# Patient Record
Sex: Female | Born: 1960 | Race: White | Hispanic: No | Marital: Married | Smoking: Never smoker
Health system: Southern US, Community
[De-identification: ages and names within clinical notes are randomized; demographics above are authoritative.]

## PROBLEM LIST (undated history)

## (undated) DIAGNOSIS — T7840XA Allergy, unspecified, initial encounter: Secondary | ICD-10-CM

## (undated) DIAGNOSIS — M1990 Unspecified osteoarthritis, unspecified site: Secondary | ICD-10-CM

## (undated) DIAGNOSIS — K5900 Constipation, unspecified: Secondary | ICD-10-CM

## (undated) DIAGNOSIS — O039 Complete or unspecified spontaneous abortion without complication: Secondary | ICD-10-CM

## (undated) DIAGNOSIS — K602 Anal fissure, unspecified: Secondary | ICD-10-CM

## (undated) DIAGNOSIS — M722 Plantar fascial fibromatosis: Secondary | ICD-10-CM

## (undated) DIAGNOSIS — I10 Essential (primary) hypertension: Secondary | ICD-10-CM

## (undated) DIAGNOSIS — S52123A Displaced fracture of head of unspecified radius, initial encounter for closed fracture: Secondary | ICD-10-CM

## (undated) DIAGNOSIS — E785 Hyperlipidemia, unspecified: Secondary | ICD-10-CM

## (undated) DIAGNOSIS — C439 Malignant melanoma of skin, unspecified: Secondary | ICD-10-CM

## (undated) HISTORY — DX: Anal fissure, unspecified: K60.2

## (undated) HISTORY — DX: Complete or unspecified spontaneous abortion without complication: O03.9

## (undated) HISTORY — DX: Malignant melanoma of skin, unspecified: C43.9

## (undated) HISTORY — DX: Hyperlipidemia, unspecified: E78.5

## (undated) HISTORY — DX: Allergy, unspecified, initial encounter: T78.40XA

## (undated) HISTORY — DX: Displaced fracture of head of unspecified radius, initial encounter for closed fracture: S52.123A

## (undated) HISTORY — DX: Essential (primary) hypertension: I10

## (undated) HISTORY — DX: Constipation, unspecified: K59.00

## (undated) HISTORY — PX: COLONOSCOPY: SHX174

## (undated) HISTORY — DX: Plantar fascial fibromatosis: M72.2

---

## 1996-05-03 HISTORY — PX: DILATION AND CURETTAGE OF UTERUS: SHX78

## 1998-05-03 HISTORY — PX: RECTAL SURGERY: SHX760

## 1998-05-03 HISTORY — PX: OTHER SURGICAL HISTORY: SHX169

## 2001-05-03 HISTORY — PX: BUNIONECTOMY: SHX129

## 2005-12-30 ENCOUNTER — Ambulatory Visit: Payer: Self-pay | Admitting: Dermatology

## 2007-05-04 LAB — HM COLONOSCOPY: HM Colonoscopy: NORMAL

## 2007-08-16 ENCOUNTER — Ambulatory Visit: Payer: Self-pay | Admitting: Gastroenterology

## 2007-08-28 ENCOUNTER — Ambulatory Visit: Payer: Self-pay | Admitting: Gastroenterology

## 2007-08-30 ENCOUNTER — Telehealth: Payer: Self-pay | Admitting: Gastroenterology

## 2007-09-05 ENCOUNTER — Encounter: Payer: Self-pay | Admitting: Gastroenterology

## 2007-10-12 ENCOUNTER — Ambulatory Visit: Payer: Self-pay | Admitting: Internal Medicine

## 2007-10-12 DIAGNOSIS — M159 Polyosteoarthritis, unspecified: Secondary | ICD-10-CM

## 2007-10-12 DIAGNOSIS — Z860100 Personal history of colon polyps, unspecified: Secondary | ICD-10-CM | POA: Insufficient documentation

## 2007-10-12 DIAGNOSIS — Z85820 Personal history of malignant melanoma of skin: Secondary | ICD-10-CM | POA: Insufficient documentation

## 2007-10-12 DIAGNOSIS — R55 Syncope and collapse: Secondary | ICD-10-CM | POA: Insufficient documentation

## 2007-10-12 DIAGNOSIS — J309 Allergic rhinitis, unspecified: Secondary | ICD-10-CM

## 2007-10-12 DIAGNOSIS — E785 Hyperlipidemia, unspecified: Secondary | ICD-10-CM

## 2007-10-12 DIAGNOSIS — I10 Essential (primary) hypertension: Secondary | ICD-10-CM | POA: Insufficient documentation

## 2007-10-13 ENCOUNTER — Telehealth: Payer: Self-pay | Admitting: Internal Medicine

## 2008-02-29 ENCOUNTER — Ambulatory Visit: Payer: Self-pay | Admitting: Dermatology

## 2010-05-03 LAB — HM PAP SMEAR: HM Pap smear: NORMAL

## 2010-05-29 LAB — HM MAMMOGRAPHY: HM Mammogram: NORMAL

## 2011-05-20 LAB — HM PAP SMEAR: HM Pap smear: NORMAL

## 2011-05-26 ENCOUNTER — Encounter: Payer: Self-pay | Admitting: Internal Medicine

## 2011-05-26 ENCOUNTER — Ambulatory Visit (INDEPENDENT_AMBULATORY_CARE_PROVIDER_SITE_OTHER): Payer: 59 | Admitting: Internal Medicine

## 2011-05-26 VITALS — BP 138/82 | HR 74 | Temp 97.9°F | Ht 69.0 in | Wt 211.0 lb

## 2011-05-26 DIAGNOSIS — Z23 Encounter for immunization: Secondary | ICD-10-CM

## 2011-05-26 DIAGNOSIS — Z1239 Encounter for other screening for malignant neoplasm of breast: Secondary | ICD-10-CM

## 2011-05-26 DIAGNOSIS — I10 Essential (primary) hypertension: Secondary | ICD-10-CM

## 2011-05-26 DIAGNOSIS — H6690 Otitis media, unspecified, unspecified ear: Secondary | ICD-10-CM

## 2011-05-26 DIAGNOSIS — N920 Excessive and frequent menstruation with regular cycle: Secondary | ICD-10-CM | POA: Insufficient documentation

## 2011-05-26 DIAGNOSIS — H6691 Otitis media, unspecified, right ear: Secondary | ICD-10-CM | POA: Insufficient documentation

## 2011-05-26 DIAGNOSIS — M722 Plantar fascial fibromatosis: Secondary | ICD-10-CM

## 2011-05-26 LAB — CBC WITH DIFFERENTIAL/PLATELET
Eosinophils Absolute: 0.9 10*3/uL — ABNORMAL HIGH (ref 0.0–0.7)
Eosinophils Relative: 7.3 % — ABNORMAL HIGH (ref 0.0–5.0)
HCT: 42.1 % (ref 36.0–46.0)
Lymphs Abs: 2 10*3/uL (ref 0.7–4.0)
MCHC: 34 g/dL (ref 30.0–36.0)
MCV: 89.7 fl (ref 78.0–100.0)
Monocytes Absolute: 0.7 10*3/uL (ref 0.1–1.0)
Neutrophils Relative %: 69.5 % (ref 43.0–77.0)
Platelets: 352 10*3/uL (ref 150.0–400.0)
RDW: 13.7 % (ref 11.5–14.6)
WBC: 12.2 10*3/uL — ABNORMAL HIGH (ref 4.5–10.5)

## 2011-05-26 LAB — COMPREHENSIVE METABOLIC PANEL
BUN: 21 mg/dL (ref 6–23)
CO2: 25 mEq/L (ref 19–32)
Calcium: 9.4 mg/dL (ref 8.4–10.5)
Chloride: 105 mEq/L (ref 96–112)
Creatinine, Ser: 0.8 mg/dL (ref 0.4–1.2)
GFR: 82.21 mL/min (ref >60.00)
Total Bilirubin: 0.5 mg/dL (ref 0.3–1.2)

## 2011-05-26 LAB — LIPID PANEL
Cholesterol: 180 mg/dL (ref 0–200)
HDL: 53.1 mg/dL (ref >39.00)
Triglycerides: 169 mg/dL — ABNORMAL HIGH (ref 0.0–149.0)
VLDL: 33.8 mg/dL (ref 0.0–40.0)

## 2011-05-26 MED ORDER — AMOXICILLIN-POT CLAVULANATE 875-125 MG PO TABS: 1.0000 | ORAL_TABLET | Freq: Two times a day (BID) | ORAL | Status: AC

## 2011-05-26 MED ORDER — MELOXICAM 15 MG PO TABS: 15.0000 mg | ORAL_TABLET | Freq: Every day | ORAL | Status: DC

## 2011-05-26 MED ORDER — LISINOPRIL-HYDROCHLOROTHIAZIDE 10-12.5 MG PO TABS: 1.0000 | ORAL_TABLET | Freq: Every day | ORAL | Status: DC

## 2011-05-26 NOTE — Assessment & Plan Note (Signed)
Symptoms seem most consistent with uterine fibroids. Will get pelvic ultrasound to evaluate endometrial lining and for fibroids. Will check CBC with lab work today. Patient will followup in one month.

## 2011-05-26 NOTE — Assessment & Plan Note (Signed)
Will restart meloxicam. If no improvement, she will contact her podiatrist for repeat steroid injection.

## 2011-05-26 NOTE — Assessment & Plan Note (Addendum)
Blood pressure is well-controlled on lisinopril hydrochlorothiazide. We'll check renal function with labs today. Will have her followup in 6 month.

## 2011-05-26 NOTE — Progress Notes (Signed)
Subjective:    Patient ID: Olivia Carpenter, female    DOB: [birth year], 51 y.o.   MRN: [id number]  HPI 51year-old female with a history of hypertension and plantar fasciitis presents to establish care. In regards to her hypertension, she notes that her blood pressure has been well controlled on lisinopril hydrochlorothiazide. She denies any headache, chest pain, palpitations. She reports full compliance with her medicine.  In regards to her plantar fasciitis, she notes that this has been a recurrent issue for her. It is located in her right foot. She has been seen by podiatry in the past and has had a cortisol injection with some improvement. Recently her symptoms have been exacerbated. She has run out of her meloxicam and would like to restart this medication. She reports significant improvement in the past using meloxicam. She has attempted to improve her footwear and wear supportive shoes. She also has stretching exercises which she performs which have helped with her symptoms.  She is also concerned today about recent menorrhagia. She notes that one year ago she tried stopping her birth control pill to see if she was going through menopause. After 3 months of no period, she had a very heavy menstrual cycle. Her OB/GYN put her back on birth control. Since that time, her periods have been heavy with blood clots. She questions whether or not she may need a D&C. She denies any abdominal pain. She denies any hot flashes. She denies any fatigue or lightheadedness.  She is also concerned today about a several week history of nasal congestion and bilateral ear pain. She notes exposure to numerous children with viral upper respiratory infections. She has been using over-the-counter cough and cold preparations with no improvement in her symptoms. She denies fever or chills. She denies cough or shortness of breath.  Outpatient Encounter Prescriptions as of 05/26/2011  Medication Sig Dispense Refill  .  levonorgestrel-ethinyl estradiol (LEVORA 0.15/30, 28,) 0.15-30 MG-MCG tablet Take 1 tablet by mouth daily.      Marland Kitchen lisinopril-hydrochlorothiazide (PRINZIDE,ZESTORETIC) 10-12.5 MG per tablet Take 1 tablet by mouth daily.  30 tablet  11  . meloxicam (MOBIC) 15 MG tablet Take 1 tablet (15 mg total) by mouth daily.  30 tablet  11  . senna-docusate (SENNA PLUS) 8.6-50 MG per tablet Take 2 tablets by mouth daily.      Marland Kitchen amoxicillin-clavulanate (AUGMENTIN) 875-125 MG per tablet Take 1 tablet by mouth 2 (two) times daily.  20 tablet  0    Review of Systems  Constitutional: Negative for fever, chills, appetite change, fatigue and unexpected weight change.  HENT: Negative for ear pain, congestion, sore throat, trouble swallowing, neck pain, voice change and sinus pressure.   Eyes: Negative for visual disturbance.  Respiratory: Negative for cough, shortness of breath, wheezing and stridor.   Cardiovascular: Negative for chest pain, palpitations and leg swelling.  Gastrointestinal: Negative for nausea, vomiting, abdominal pain, diarrhea, constipation, blood in stool, abdominal distention and anal bleeding.  Genitourinary: Positive for menstrual problem. Negative for dysuria and flank pain.  Musculoskeletal: Positive for myalgias and arthralgias. Negative for gait problem.  Skin: Negative for color change and rash.  Neurological: Negative for dizziness and headaches.  Hematological: Negative for adenopathy. Does not bruise/bleed easily.  Psychiatric/Behavioral: Negative for suicidal ideas, sleep disturbance and dysphoric mood. The patient is not nervous/anxious.    BP 138/82  Pulse 74  Temp(Src) 97.9 F (36.6 C) (Oral)  Ht 5\' 9"  (1.753 m)  Wt 211 lb (95.709 kg)  BMI  31.16 kg/m2  SpO2 98%  LMP 05/13/2011     Objective:   Physical Exam  Constitutional: She is oriented to person, place, and time. She appears well-developed and well-nourished. No distress.  HENT:  Head: Normocephalic and  atraumatic.  Right Ear: External ear normal. Tympanic membrane is erythematous and bulging. A middle ear effusion is present.  Left Ear: External ear normal. Tympanic membrane is not erythematous and not bulging. A middle ear effusion is present.  Nose: Nose normal.  Mouth/Throat: Oropharynx is clear and moist. No oropharyngeal exudate.  Eyes: Conjunctivae are normal. Pupils are equal, round, and reactive to light. Right eye exhibits no discharge. Left eye exhibits no discharge. No scleral icterus.  Neck: Normal range of motion. Neck supple. No tracheal deviation present. No thyromegaly present.  Cardiovascular: Normal rate, regular rhythm, normal heart sounds and intact distal pulses.  Exam reveals no gallop and no friction rub.   No murmur heard.      Loud second heart sound  Pulmonary/Chest: Effort normal and breath sounds normal. No respiratory distress. She has no wheezes. She has no rales. She exhibits no tenderness.  Abdominal: Soft. Bowel sounds are normal. She exhibits no distension and no mass. There is no tenderness. There is no rebound and no guarding.  Musculoskeletal: Normal range of motion. She exhibits no edema and no tenderness.  Lymphadenopathy:    She has no cervical adenopathy.  Neurological: She is alert and oriented to person, place, and time. No cranial nerve deficit. She exhibits normal muscle tone. Coordination normal.  Skin: Skin is warm and dry. No rash noted. She is not diaphoretic. No erythema. No pallor.  Psychiatric: She has a normal mood and affect. Her behavior is normal. Judgment and thought content normal.          Assessment & Plan:

## 2011-05-26 NOTE — Assessment & Plan Note (Signed)
Exam is remarkable for her right otitis media. Will treat with Augmentin. She will use meloxicam for both joint pain and ear pain. She will followup in one month for recheck. She will call sooner if symptoms are not improving.

## 2011-05-28 ENCOUNTER — Encounter: Payer: Self-pay | Admitting: Internal Medicine

## 2011-06-01 ENCOUNTER — Ambulatory Visit: Payer: Self-pay | Admitting: Internal Medicine

## 2011-06-02 ENCOUNTER — Telehealth: Payer: Self-pay | Admitting: Internal Medicine

## 2011-06-02 DIAGNOSIS — D259 Leiomyoma of uterus, unspecified: Secondary | ICD-10-CM

## 2011-06-02 NOTE — Telephone Encounter (Signed)
[place] of uterus showed possible uterine fibroid. I would like for her to see GYN for evaluation.

## 2011-06-02 NOTE — Telephone Encounter (Signed)
Patient informed, referral entered 

## 2011-06-07 ENCOUNTER — Encounter: Payer: Self-pay | Admitting: *Deleted

## 2011-06-17 ENCOUNTER — Encounter: Payer: Self-pay | Admitting: Internal Medicine

## 2011-07-07 ENCOUNTER — Encounter: Payer: Self-pay | Admitting: Internal Medicine

## 2011-07-18 LAB — HM MAMMOGRAPHY: HM Mammogram: NORMAL

## 2011-08-05 ENCOUNTER — Encounter: Payer: Self-pay | Admitting: Internal Medicine

## 2011-08-17 ENCOUNTER — Encounter: Payer: Self-pay | Admitting: Gastroenterology

## 2011-11-25 ENCOUNTER — Encounter: Payer: 59 | Admitting: Internal Medicine

## 2011-12-02 ENCOUNTER — Encounter: Payer: 59 | Admitting: Internal Medicine

## 2012-05-19 ENCOUNTER — Ambulatory Visit (INDEPENDENT_AMBULATORY_CARE_PROVIDER_SITE_OTHER): Payer: PRIVATE HEALTH INSURANCE | Admitting: Internal Medicine

## 2012-05-19 ENCOUNTER — Encounter: Payer: Self-pay | Admitting: Internal Medicine

## 2012-05-19 VITALS — BP 130/80 | HR 80 | Temp 98.2°F | Wt 206.0 lb

## 2012-05-19 DIAGNOSIS — I10 Essential (primary) hypertension: Secondary | ICD-10-CM

## 2012-05-19 DIAGNOSIS — M722 Plantar fascial fibromatosis: Secondary | ICD-10-CM

## 2012-05-19 DIAGNOSIS — N920 Excessive and frequent menstruation with regular cycle: Secondary | ICD-10-CM

## 2012-05-19 DIAGNOSIS — Z0000 Encounter for general adult medical examination without abnormal findings: Secondary | ICD-10-CM

## 2012-05-19 DIAGNOSIS — H6690 Otitis media, unspecified, unspecified ear: Secondary | ICD-10-CM

## 2012-05-19 DIAGNOSIS — H6693 Otitis media, unspecified, bilateral: Secondary | ICD-10-CM | POA: Insufficient documentation

## 2012-05-19 MED ORDER — LISINOPRIL-HYDROCHLOROTHIAZIDE 10-12.5 MG PO TABS: 1.0000 | ORAL_TABLET | Freq: Every day | ORAL | Status: DC

## 2012-05-19 MED ORDER — AMOXICILLIN-POT CLAVULANATE 875-125 MG PO TABS: 1.0000 | ORAL_TABLET | Freq: Two times a day (BID) | ORAL | Status: DC

## 2012-05-19 MED ORDER — MELOXICAM 15 MG PO TABS: 15.0000 mg | ORAL_TABLET | Freq: Every day | ORAL | Status: DC

## 2012-05-19 NOTE — Progress Notes (Signed)
Subjective:    Patient ID: Olivia Carpenter, female    DOB: 05/31/1960, 51 y.o.   MRN: [id number]  HPI 52 year old female with history of hypertension, plantar fasciitis, menorrhagia presents for followup. At her last visit there was some concern about heavy menstrual bleeding. She was seen by OB/GYN and reports that pelvic ultrasound was normal. She was instructed to stop her birth control pill. She reports she's had no further menstrual cycles with the exception of some light spotting in November 2013. Also at her last visit, she was noted to have right otitis media. She reports that in the interim since her last visit, she has had almost constant nasal congestion and pressure. She also has frequent sneezing particularly when outdoors. She questions whether she has an allergic component leading to recurrent sinus and ear infections. She denies any fever, chills, shortness of breath. At present, she has bilateral ear pain and nasal congestion. Next  In regards to plantar fasciitis, she reports symptoms are well controlled with meloxicam.  Outpatient Encounter Prescriptions as of 05/19/2012  Medication Sig Dispense Refill  . lisinopril-hydrochlorothiazide (PRINZIDE,ZESTORETIC) 10-12.5 MG per tablet Take 1 tablet by mouth daily.  90 tablet  4  . meloxicam (MOBIC) 15 MG tablet Take 1 tablet (15 mg total) by mouth daily.  90 tablet  4  . senna-docusate (SENNA PLUS) 8.6-50 MG per tablet Take 2 tablets by mouth daily.      Marland Kitchen amoxicillin-clavulanate (AUGMENTIN) 875-125 MG per tablet Take 1 tablet by mouth 2 (two) times daily.  20 tablet  0   BP 130/80  Pulse 80  Temp 98.2 F (36.8 C) (Oral)  Wt 206 lb (93.441 kg)  Review of Systems  Constitutional: Negative for fever, chills, appetite change, fatigue and unexpected weight change.  HENT: Positive for ear pain, congestion, sneezing, postnasal drip and sinus pressure. Negative for sore throat, trouble swallowing, neck pain and voice change.   Eyes:  Negative for visual disturbance.  Respiratory: Negative for cough, shortness of breath, wheezing and stridor.   Cardiovascular: Negative for chest pain, palpitations and leg swelling.  Gastrointestinal: Negative for nausea, vomiting, abdominal pain, diarrhea, constipation, blood in stool, abdominal distention and anal bleeding.  Genitourinary: Negative for dysuria and flank pain.  Musculoskeletal: Negative for myalgias, arthralgias and gait problem.  Skin: Negative for color change and rash.  Neurological: Negative for dizziness and headaches.  Hematological: Negative for adenopathy. Does not bruise/bleed easily.  Psychiatric/Behavioral: Negative for suicidal ideas, sleep disturbance and dysphoric mood. The patient is not nervous/anxious.        Objective:   Physical Exam  Constitutional: She is oriented to person, place, and time. She appears well-developed and well-nourished. No distress.  HENT:  Head: Normocephalic and atraumatic.  Right Ear: External ear normal. Tympanic membrane is erythematous and bulging. A middle ear effusion is present.  Left Ear: External ear normal. Tympanic membrane is erythematous and bulging. A middle ear effusion is present.  Nose: Mucosal edema present.  Mouth/Throat: Oropharynx is clear and moist. No oropharyngeal exudate.  Eyes: Conjunctivae normal are normal. Pupils are equal, round, and reactive to light. Right eye exhibits no discharge. Left eye exhibits no discharge. No scleral icterus.  Neck: Normal range of motion. Neck supple. No tracheal deviation present. No thyromegaly present.  Cardiovascular: Normal rate, regular rhythm, normal heart sounds and intact distal pulses.  Exam reveals no gallop and no friction rub.   No murmur heard. Pulmonary/Chest: Effort normal and breath sounds normal. No respiratory distress. She  has no wheezes. She has no rales. She exhibits no tenderness.  Musculoskeletal: Normal range of motion. She exhibits no edema and no  tenderness.  Lymphadenopathy:    She has no cervical adenopathy.  Neurological: She is alert and oriented to person, place, and time. No cranial nerve deficit. She exhibits normal muscle tone. Coordination normal.  Skin: Skin is warm and dry. No rash noted. She is not diaphoretic. No erythema. No pallor.  Psychiatric: She has a normal mood and affect. Her behavior is normal. Judgment and thought content normal.          Assessment & Plan:

## 2012-05-19 NOTE — Assessment & Plan Note (Signed)
BP Readings from Last 3 Encounters:  05/19/12 130/80  08/12/10 120/80  05/26/11 138/82     Blood pressure well-controlled on current medications. Will continue. Followup in 3 months for physical exam and repeat lab work.

## 2012-05-19 NOTE — Assessment & Plan Note (Signed)
Patient with bilateral otitis media and chronic nasal drainage which is most consistent with allergic rhinitis. Will set up evaluation with ENT/allergist. Question if she might benefit from allergy testing. Will treat otitis media with Augmentin. Patient will use ibuprofen as needed for pain. She will followup if symptoms are not improving over the next 48-72 hours.

## 2012-05-19 NOTE — Assessment & Plan Note (Signed)
Patient reports she was seen by OB/GYN. Symptoms of menorrhagia has stopped. Will request records on recent evaluation including pelvic ultrasound.

## 2012-05-19 NOTE — Assessment & Plan Note (Signed)
Symptoms well controlled with meloxicam. We'll continue. 

## 2012-06-17 ENCOUNTER — Other Ambulatory Visit: Payer: Self-pay

## 2012-08-11 ENCOUNTER — Encounter: Payer: Self-pay | Admitting: Gastroenterology

## 2012-08-17 ENCOUNTER — Other Ambulatory Visit: Payer: PRIVATE HEALTH INSURANCE

## 2012-08-24 ENCOUNTER — Encounter: Payer: PRIVATE HEALTH INSURANCE | Admitting: Internal Medicine

## 2012-09-05 ENCOUNTER — Ambulatory Visit (AMBULATORY_SURGERY_CENTER): Payer: PRIVATE HEALTH INSURANCE | Admitting: *Deleted

## 2012-09-05 VITALS — Ht 69.0 in | Wt 208.0 lb

## 2012-09-05 DIAGNOSIS — Z1211 Encounter for screening for malignant neoplasm of colon: Secondary | ICD-10-CM

## 2012-09-05 MED ORDER — MOVIPREP 100 G PO SOLR: ORAL | Status: DC

## 2012-09-11 ENCOUNTER — Encounter: Payer: Self-pay | Admitting: Gastroenterology

## 2012-09-19 ENCOUNTER — Encounter: Payer: Self-pay | Admitting: Gastroenterology

## 2012-09-19 ENCOUNTER — Other Ambulatory Visit: Payer: PRIVATE HEALTH INSURANCE | Admitting: Gastroenterology

## 2012-09-19 ENCOUNTER — Ambulatory Visit (AMBULATORY_SURGERY_CENTER): Payer: PRIVATE HEALTH INSURANCE | Admitting: Gastroenterology

## 2012-09-19 VITALS — BP 136/72 | HR 56 | Temp 97.1°F | Resp 22 | Ht 69.0 in | Wt 208.0 lb

## 2012-09-19 DIAGNOSIS — Z1211 Encounter for screening for malignant neoplasm of colon: Secondary | ICD-10-CM

## 2012-09-19 DIAGNOSIS — Z86010 Personal history of colonic polyps: Secondary | ICD-10-CM

## 2012-09-19 DIAGNOSIS — Z800 Family history of malignant neoplasm of digestive organs: Secondary | ICD-10-CM

## 2012-09-19 MED ORDER — SODIUM CHLORIDE 0.9 % IV SOLN: 500.0000 mL | INTRAVENOUS | Status: DC

## 2012-09-19 NOTE — Progress Notes (Signed)
Patient did not experience any of the following events: a burn prior to discharge; a fall within the facility; wrong site/side/patient/procedure/implant event; or a hospital transfer or hospital admission upon discharge from the facility. (G8907) Patient did not have preoperative order for IV antibiotic SSI prophylaxis. (G8918)  

## 2012-09-19 NOTE — Patient Instructions (Addendum)
Discharge instructions given with verbal understanding. Normal exam. Resume previous medications. YOU HAD AN ENDOSCOPIC PROCEDURE TODAY AT THE  ENDOSCOPY CENTER: Refer to the procedure report that was given to you for any specific questions about what was found during the examination.  If the procedure report does not answer your questions, please call your gastroenterologist to clarify.  If you requested that your care partner not be given the details of your procedure findings, then the procedure report has been included in a sealed envelope for you to review at your convenience later.  YOU SHOULD EXPECT: Some feelings of bloating in the abdomen. Passage of more gas than usual.  Walking can help get rid of the air that was put into your GI tract during the procedure and reduce the bloating. If you had a lower endoscopy (such as a colonoscopy or flexible sigmoidoscopy) you may notice spotting of blood in your stool or on the toilet paper. If you underwent a bowel prep for your procedure, then you may not have a normal bowel movement for a few days.  DIET: Your first meal following the procedure should be a light meal and then it is ok to progress to your normal diet.  A half-sandwich or bowl of soup is an example of a good first meal.  Heavy or fried foods are harder to digest and may make you feel nauseous or bloated.  Likewise meals heavy in dairy and vegetables can cause extra gas to form and this can also increase the bloating.  Drink plenty of fluids but you should avoid alcoholic beverages for 24 hours.  ACTIVITY: Your care partner should take you home directly after the procedure.  You should plan to take it easy, moving slowly for the rest of the day.  You can resume normal activity the day after the procedure however you should NOT DRIVE or use heavy machinery for 24 hours (because of the sedation medicines used during the test).    SYMPTOMS TO REPORT IMMEDIATELY: A gastroenterologist  can be reached at any hour.  During normal business hours, 8:30 AM to 5:00 PM Monday through Friday, call [phone].  After hours and on weekends, please call the GI answering service at [phone] who will take a message and have the physician on call contact you.   Following lower endoscopy (colonoscopy or flexible sigmoidoscopy):  Excessive amounts of blood in the stool  Significant tenderness or worsening of abdominal pains  Swelling of the abdomen that is new, acute  Fever of 100F or higher  FOLLOW UP: If any biopsies were taken you will be contacted by phone or by letter within the next 1-3 weeks.  Call your gastroenterologist if you have not heard about the biopsies in 3 weeks.  Our staff will call the home number listed on your records the next business day following your procedure to check on you and address any questions or concerns that you may have at that time regarding the information given to you following your procedure. This is a courtesy call and so if there is no answer at the home number and we have not heard from you through the emergency physician on call, we will assume that you have returned to your regular daily activities without incident.  SIGNATURES/CONFIDENTIALITY: You and/or your care partner have signed paperwork which will be entered into your electronic medical record.  These signatures attest to the fact that that the information above on your After Visit Summary has been reviewed   and is understood.  Full responsibility of the confidentiality of this discharge information lies with you and/or your care-partner. 

## 2012-09-19 NOTE — Op Note (Signed)
[facility] [address] [place], [address]   COLONOSCOPY PROCEDURE REPORT  PATIENT: Olivia Carpenter, Olivia Carpenter  MR#: [id number] BIRTHDATE: October 27, 1960 , 52  yrs. old GENDER: Female ENDOSCOPIST: Rachael Fee, MD PROCEDURE DATE:  09/19/2012 PROCEDURE:   Colonoscopy, screening ASA CLASS:   Class II INDICATIONS:brother had colon cancer; 2009 colonoscopy with HPs. MEDICATIONS: Fentanyl 50 mcg IV, Versed 8 mg IV, and These medications were titrated to patient response per physician's verbal order  DESCRIPTION OF PROCEDURE:   After the risks benefits and alternatives of the procedure were thoroughly explained, informed consent was obtained.  A digital rectal exam revealed no abnormalities of the rectum.   The LB [id number]  endoscope was introduced through the anus and advanced to the cecum, which was identified by both the appendix and ileocecal valve. No adverse events experienced.   The quality of the prep was good.  The instrument was then slowly withdrawn as the colon was fully examined.   COLON FINDINGS: A normal appearing cecum, ileocecal valve, and appendiceal orifice were identified.  The ascending, hepatic flexure, transverse, splenic flexure, descending, sigmoid colon and rectum appeared unremarkable.  No polyps or cancers were seen. Retroflexed views revealed no abnormalities. The time to cecum=4 minutes 29 seconds.  Withdrawal time=10 minutes 36 seconds.  The scope was withdrawn and the procedure completed. COMPLICATIONS: There were no complications.  ENDOSCOPIC IMPRESSION: Normal colon No polyps or cancers  RECOMMENDATIONS: Given your significant family history of colon cancer, you should have a repeat colonoscopy in 5 years   eSigned:  Rachael Fee, MD 09/19/2012 11:36 AM

## 2012-09-20 ENCOUNTER — Telehealth: Payer: Self-pay

## 2012-09-20 NOTE — Telephone Encounter (Signed)
Unable to leave message no answer. Phone rang at least 20 times.

## 2012-10-05 DIAGNOSIS — Z86018 Personal history of other benign neoplasm: Secondary | ICD-10-CM

## 2012-10-05 HISTORY — DX: Personal history of other benign neoplasm: Z86.018

## 2012-11-16 ENCOUNTER — Other Ambulatory Visit (INDEPENDENT_AMBULATORY_CARE_PROVIDER_SITE_OTHER): Payer: PRIVATE HEALTH INSURANCE

## 2012-11-16 DIAGNOSIS — I10 Essential (primary) hypertension: Secondary | ICD-10-CM

## 2012-11-16 DIAGNOSIS — Z0000 Encounter for general adult medical examination without abnormal findings: Secondary | ICD-10-CM

## 2012-11-16 LAB — CBC WITH DIFFERENTIAL/PLATELET
Basophils Relative: 0.7 % (ref 0.0–3.0)
Eosinophils Relative: 10 % — ABNORMAL HIGH (ref 0.0–5.0)
Lymphocytes Relative: 24.4 % (ref 12.0–46.0)
MCV: 90.8 fl (ref 78.0–100.0)
Monocytes Relative: 9.1 % (ref 3.0–12.0)
Neutrophils Relative %: 55.8 % (ref 43.0–77.0)
RBC: 4.53 Mil/uL (ref 3.87–5.11)
WBC: 7.1 10*3/uL (ref 4.5–10.5)

## 2012-11-16 LAB — MICROALBUMIN / CREATININE URINE RATIO
Creatinine,U: 305.7 mg/dL
Microalb Creat Ratio: 0.4 mg/g (ref 0.0–30.0)
Microalb, Ur: 1.2 mg/dL (ref 0.0–1.9)

## 2012-11-16 LAB — LIPID PANEL: Triglycerides: 100 mg/dL (ref 0.0–149.0)

## 2012-11-16 LAB — COMPREHENSIVE METABOLIC PANEL
AST: 18 U/L (ref 0–37)
Albumin: 3.7 g/dL (ref 3.5–5.2)
BUN: 16 mg/dL (ref 6–23)
Calcium: 9.6 mg/dL (ref 8.4–10.5)
Chloride: 105 mEq/L (ref 96–112)
Glucose, Bld: 99 mg/dL (ref 70–99)
Potassium: 4.4 mEq/L (ref 3.5–5.1)

## 2012-11-23 ENCOUNTER — Ambulatory Visit (INDEPENDENT_AMBULATORY_CARE_PROVIDER_SITE_OTHER): Payer: PRIVATE HEALTH INSURANCE | Admitting: Internal Medicine

## 2012-11-23 ENCOUNTER — Other Ambulatory Visit (HOSPITAL_COMMUNITY)
Admission: RE | Admit: 2012-11-23 | Discharge: 2012-11-23 | Disposition: A | Discharge status: Home / Self Care | Payer: PRIVATE HEALTH INSURANCE | Source: Ambulatory Visit | Attending: Internal Medicine | Admitting: Internal Medicine

## 2012-11-23 ENCOUNTER — Encounter: Payer: Self-pay | Admitting: Internal Medicine

## 2012-11-23 VITALS — BP 130/88 | HR 66 | Temp 98.4°F | Ht 69.5 in | Wt 209.0 lb

## 2012-11-23 DIAGNOSIS — Z1151 Encounter for screening for human papillomavirus (HPV): Secondary | ICD-10-CM | POA: Insufficient documentation

## 2012-11-23 DIAGNOSIS — E669 Obesity, unspecified: Secondary | ICD-10-CM

## 2012-11-23 DIAGNOSIS — Z0000 Encounter for general adult medical examination without abnormal findings: Secondary | ICD-10-CM

## 2012-11-23 DIAGNOSIS — Z01419 Encounter for gynecological examination (general) (routine) without abnormal findings: Secondary | ICD-10-CM | POA: Insufficient documentation

## 2012-11-23 LAB — HM PAP SMEAR: HM Pap smear: NEGATIVE

## 2012-11-23 NOTE — Assessment & Plan Note (Signed)
Encouraged healthy diet with increased bleeding protein and limited processed carbohydrates and saturated fats. Encouraged goal of exercise 3x per day.

## 2012-11-23 NOTE — Progress Notes (Signed)
Subjective:    Patient ID: Olivia Carpenter, female    DOB: 1961-04-24, 52 y.o.   MRN: [id number]  HPI 52 year old female with history of hypertension presents for annual exam. She reports she is generally feeling well. No concerns today. She is due for mammogram. Her last Pap smear was 2012 and was reportedly normal. No history of previously abnormal Paps. She is trying to follow a healthy diet and get regular physical activity.  Outpatient Encounter Prescriptions as of 11/23/2012  Medication Sig Dispense Refill  . acetaminophen (TYLENOL) 500 MG tablet Take 500 mg by mouth every 6 (six) hours as needed for pain.      . fexofenadine (ALLEGRA) 180 MG tablet Take 180 mg by mouth daily.      . fluticasone (VERAMYST) 27.5 MCG/SPRAY nasal spray Place 2 sprays into the nose daily.      Marland Kitchen lisinopril-hydrochlorothiazide (PRINZIDE,ZESTORETIC) 10-12.5 MG per tablet Take 1 tablet by mouth daily.  90 tablet  4  . meloxicam (MOBIC) 15 MG tablet Take 1 tablet (15 mg total) by mouth daily.  90 tablet  4  . Pseudoephedrine-Ibuprofen 30-200 MG TABS Take by mouth as needed.      . senna-docusate (SENNA PLUS) 8.6-50 MG per tablet Take 2 tablets by mouth daily.      Marland Kitchen UNABLE TO FIND Med Name: Allergy shots weekly       No facility-administered encounter medications on file as of 11/23/2012.   BP 130/88  Pulse 66  Temp(Src) 98.4 F (36.9 C) (Oral)  Ht 5' 9.5" (1.765 m)  Wt 209 lb (94.802 kg)  BMI 30.43 kg/m2  SpO2 98%  Review of Systems  Constitutional: Negative for fever, chills, appetite change, fatigue and unexpected weight change.  HENT: Negative for ear pain, congestion, sore throat, trouble swallowing, neck pain, voice change and sinus pressure.   Eyes: Negative for visual disturbance.  Respiratory: Negative for cough, shortness of breath, wheezing and stridor.   Cardiovascular: Negative for chest pain, palpitations and leg swelling.  Gastrointestinal: Negative for nausea, vomiting, abdominal pain,  diarrhea, constipation, blood in stool, abdominal distention and anal bleeding.  Genitourinary: Negative for dysuria and flank pain.  Musculoskeletal: Negative for myalgias, arthralgias and gait problem.  Skin: Negative for color change and rash.  Neurological: Negative for dizziness and headaches.  Hematological: Negative for adenopathy. Does not bruise/bleed easily.  Psychiatric/Behavioral: Negative for suicidal ideas, sleep disturbance and dysphoric mood. The patient is not nervous/anxious.        Objective:   Physical Exam  Constitutional: She is oriented to person, place, and time. She appears well-developed and well-nourished. No distress.  HENT:  Head: Normocephalic and atraumatic.  Right Ear: External ear normal.  Left Ear: External ear normal.  Nose: Nose normal.  Mouth/Throat: Oropharynx is clear and moist. No oropharyngeal exudate.  Eyes: Conjunctivae are normal. Pupils are equal, round, and reactive to light. Right eye exhibits no discharge. Left eye exhibits no discharge. No scleral icterus.  Neck: Normal range of motion. Neck supple. No tracheal deviation present. No thyromegaly present.  Cardiovascular: Normal rate, regular rhythm, normal heart sounds and intact distal pulses.  Exam reveals no gallop and no friction rub.   No murmur heard. Pulmonary/Chest: Effort normal and breath sounds normal. No respiratory distress. She has no wheezes. She has no rales. She exhibits no tenderness.  Abdominal: Soft. Bowel sounds are normal. She exhibits no distension and no mass. There is no tenderness. There is no rebound and no guarding.  Genitourinary:  Rectum normal, vagina normal and uterus normal. No breast swelling, tenderness, discharge or bleeding. Pelvic exam was performed with patient supine. There is no rash, tenderness or lesion on the right labia. There is no rash, tenderness or lesion on the left labia. Uterus is not enlarged and not tender. Cervix exhibits no motion  tenderness, no discharge and no friability. Right adnexum displays no mass, no tenderness and no fullness. Left adnexum displays no mass, no tenderness and no fullness. No erythema or tenderness around the vagina. No vaginal discharge found.  Musculoskeletal: Normal range of motion. She exhibits no edema and no tenderness.  Lymphadenopathy:    She has no cervical adenopathy.  Neurological: She is alert and oriented to person, place, and time. No cranial nerve deficit. She exhibits normal muscle tone. Coordination normal.  Skin: Skin is warm and dry. No rash noted. She is not diaphoretic. No erythema. No pallor.  Psychiatric: She has a normal mood and affect. Her behavior is normal. Judgment and thought content normal.          Assessment & Plan:

## 2012-11-23 NOTE — Addendum Note (Signed)
Addended by: Montine Circle D on: 11/23/2012 01:42 PM   Modules accepted: Orders

## 2012-11-23 NOTE — Assessment & Plan Note (Signed)
General medical exam including breast and pelvic exam normal today. Pap is pending. Mammogram ordered. Health maintenance is up to date. Reviewed recent labs including CMP, lipid profile which were normal. Encouraged continued efforts at healthy diet and regular physical activity.

## 2012-12-17 LAB — HM COLONOSCOPY

## 2012-12-17 LAB — HM MAMMOGRAPHY: HM Mammogram: NORMAL

## 2013-01-08 ENCOUNTER — Encounter: Payer: Self-pay | Admitting: Internal Medicine

## 2013-03-08 ENCOUNTER — Other Ambulatory Visit: Payer: Self-pay

## 2013-08-30 ENCOUNTER — Other Ambulatory Visit: Payer: Self-pay | Admitting: Internal Medicine

## 2013-08-30 DIAGNOSIS — I10 Essential (primary) hypertension: Secondary | ICD-10-CM

## 2013-08-30 DIAGNOSIS — M722 Plantar fascial fibromatosis: Secondary | ICD-10-CM

## 2013-08-30 MED ORDER — LISINOPRIL-HYDROCHLOROTHIAZIDE 10-12.5 MG PO TABS: 1.0000 | ORAL_TABLET | Freq: Every day | ORAL | Status: DC

## 2013-08-30 NOTE — Telephone Encounter (Signed)
Lisinopril sent to pharmacy. Ok to refill her Meloxicam for Dr. Gilford Rile?

## 2013-08-30 NOTE — Telephone Encounter (Signed)
Pt scheduled cpe 12/17/13.  States she only has 25 days left of medication and needs these refilled until her cpe.  BP med and meloxicam.

## 2013-09-02 MED ORDER — MELOXICAM 15 MG PO TABS: 15.0000 mg | ORAL_TABLET | Freq: Every day | ORAL | Status: DC

## 2013-10-11 ENCOUNTER — Other Ambulatory Visit: Payer: Self-pay | Admitting: Adult Health

## 2013-10-11 NOTE — Telephone Encounter (Signed)
Refill

## 2013-11-11 ENCOUNTER — Other Ambulatory Visit: Payer: Self-pay | Admitting: Internal Medicine

## 2013-11-12 NOTE — Telephone Encounter (Signed)
Last refill 6.11.15, last OV 7.24.15, future OV 8.17.15.  Please advise refill.

## 2013-12-17 ENCOUNTER — Encounter: Payer: Self-pay | Admitting: Internal Medicine

## 2013-12-17 ENCOUNTER — Ambulatory Visit (INDEPENDENT_AMBULATORY_CARE_PROVIDER_SITE_OTHER): Payer: PRIVATE HEALTH INSURANCE | Admitting: Internal Medicine

## 2013-12-17 VITALS — BP 122/78 | HR 71 | Temp 98.1°F | Ht 68.5 in | Wt 204.0 lb

## 2013-12-17 DIAGNOSIS — Z0000 Encounter for general adult medical examination without abnormal findings: Secondary | ICD-10-CM

## 2013-12-17 DIAGNOSIS — E669 Obesity, unspecified: Secondary | ICD-10-CM

## 2013-12-17 DIAGNOSIS — I10 Essential (primary) hypertension: Secondary | ICD-10-CM

## 2013-12-17 LAB — CBC WITH DIFFERENTIAL/PLATELET
BASOS ABS: 0 10*3/uL (ref 0.0–0.1)
Basophils Relative: 0.6 % (ref 0.0–3.0)
EOS ABS: 0.6 10*3/uL (ref 0.0–0.7)
Eosinophils Relative: 8.7 % — ABNORMAL HIGH (ref 0.0–5.0)
HCT: 39.7 % (ref 36.0–46.0)
Hemoglobin: 13.5 g/dL (ref 12.0–15.0)
LYMPHS PCT: 29.4 % (ref 12.0–46.0)
Lymphs Abs: 2.1 10*3/uL (ref 0.7–4.0)
MCHC: 33.9 g/dL (ref 30.0–36.0)
MCV: 88.9 fl (ref 78.0–100.0)
MONOS PCT: 6.1 % (ref 3.0–12.0)
Monocytes Absolute: 0.4 10*3/uL (ref 0.1–1.0)
NEUTROS PCT: 55.2 % (ref 43.0–77.0)
Neutro Abs: 4 10*3/uL (ref 1.4–7.7)
Platelets: 287 10*3/uL (ref 150.0–400.0)
RBC: 4.47 Mil/uL (ref 3.87–5.11)
RDW: 13.4 % (ref 11.5–15.5)
WBC: 7.3 10*3/uL (ref 4.0–10.5)

## 2013-12-17 LAB — COMPREHENSIVE METABOLIC PANEL
ALBUMIN: 3.8 g/dL (ref 3.5–5.2)
ALK PHOS: 91 U/L (ref 39–117)
ALT: 16 U/L (ref 0–35)
AST: 20 U/L (ref 0–37)
BUN: 20 mg/dL (ref 6–23)
CO2: 27 mEq/L (ref 19–32)
CREATININE: 0.8 mg/dL (ref 0.4–1.2)
Calcium: 9.7 mg/dL (ref 8.4–10.5)
Chloride: 106 mEq/L (ref 96–112)
GFR: 82.64 mL/min (ref >60.00)
Glucose, Bld: 84 mg/dL (ref 70–99)
POTASSIUM: 4.3 meq/L (ref 3.5–5.1)
Sodium: 142 mEq/L (ref 135–145)
Total Bilirubin: 0.7 mg/dL (ref 0.2–1.2)
Total Protein: 6.7 g/dL (ref 6.0–8.3)

## 2013-12-17 LAB — LIPID PANEL
Cholesterol: 214 mg/dL — ABNORMAL HIGH (ref 0–200)
HDL: 56.7 mg/dL (ref >39.00)
LDL Cholesterol: 130 mg/dL — ABNORMAL HIGH (ref 0–99)
NONHDL: 157.3
TRIGLYCERIDES: 138 mg/dL (ref 0.0–149.0)
Total CHOL/HDL Ratio: 4
VLDL: 27.6 mg/dL (ref 0.0–40.0)

## 2013-12-17 LAB — MICROALBUMIN / CREATININE URINE RATIO
CREATININE, U: 280 mg/dL
MICROALB/CREAT RATIO: 0.6 mg/g (ref 0.0–30.0)
Microalb, Ur: 1.8 mg/dL (ref 0.0–1.9)

## 2013-12-17 LAB — TSH: TSH: 2.29 u[IU]/mL (ref 0.35–4.50)

## 2013-12-17 MED ORDER — LISINOPRIL-HYDROCHLOROTHIAZIDE 10-12.5 MG PO TABS: 1.0000 | ORAL_TABLET | Freq: Every day | ORAL | Status: DC

## 2013-12-17 MED ORDER — MELOXICAM 15 MG PO TABS: 15.0000 mg | ORAL_TABLET | Freq: Every day | ORAL | Status: DC

## 2013-12-17 NOTE — Patient Instructions (Signed)

## 2013-12-17 NOTE — Assessment & Plan Note (Signed)
General medical exam including breast exam normal today. PAP and pelvic deferred as normal in 2014. Plan repeat PAP in 2017. Mammogram ordered. Colonoscopy UTD and reviewed. Labs today including CBC, CMP, lipids. Encouraged healthy diet and exercise.

## 2013-12-17 NOTE — Progress Notes (Signed)
Pre visit review using our clinic review tool, if applicable. No additional management support is needed unless otherwise documented below in the visit note. 

## 2013-12-17 NOTE — Assessment & Plan Note (Signed)
Wt Readings from Last 3 Encounters:  12/17/13 204 lb (92.534 kg)  11/23/12 209 lb (94.802 kg)  09/19/12 208 lb (94.348 kg)   Body mass index is 30.56 kg/(m^2). Encouraged healthy diet and exercise.

## 2013-12-17 NOTE — Assessment & Plan Note (Signed)
BP Readings from Last 3 Encounters:  12/17/13 122/78  11/23/12 130/88  09/19/12 136/72   BP well controlled. Renal function today with labs.

## 2013-12-17 NOTE — Progress Notes (Signed)
Subjective:    Patient ID: Olivia Carpenter, female    DOB: March 13, 1961, 53 y.o.   MRN: [id number]  HPI 53YO female presents for annual exam.  Feeling well. Physically active. Follows regular diet. No concerns today.  Review of Systems  Constitutional: Negative for fever, chills, appetite change, fatigue and unexpected weight change.  Eyes: Negative for visual disturbance.  Respiratory: Negative for shortness of breath.   Cardiovascular: Negative for chest pain and leg swelling.  Gastrointestinal: Negative for nausea, vomiting, abdominal pain, diarrhea and constipation.  Musculoskeletal: Negative for arthralgias and myalgias.  Skin: Negative for color change and rash.  Hematological: Negative for adenopathy. Does not bruise/bleed easily.  Psychiatric/Behavioral: Negative for dysphoric mood. The patient is not nervous/anxious.        Objective:    BP 122/78  Pulse 71  Temp(Src) 98.1 F (36.7 C) (Oral)  Ht 5' 8.5" (1.74 m)  Wt 204 lb (92.534 kg)  BMI 30.56 kg/m2  SpO2 97% Physical Exam  Constitutional: She is oriented to person, place, and time. She appears well-developed and well-nourished. No distress.  HENT:  Head: Normocephalic and atraumatic.  Right Ear: External ear normal.  Left Ear: External ear normal.  Nose: Nose normal.  Mouth/Throat: Oropharynx is clear and moist. No oropharyngeal exudate.  Eyes: Conjunctivae are normal. Pupils are equal, round, and reactive to light. Right eye exhibits no discharge. Left eye exhibits no discharge. No scleral icterus.  Neck: Normal range of motion. Neck supple. No tracheal deviation present. No thyromegaly present.  Cardiovascular: Normal rate, regular rhythm, normal heart sounds and intact distal pulses.  Exam reveals no gallop and no friction rub.   No murmur heard. Pulmonary/Chest: Effort normal and breath sounds normal. No accessory muscle usage. Not tachypneic. No respiratory distress. She has no decreased breath sounds.  She has no wheezes. She has no rales. She exhibits no tenderness. Right breast exhibits no inverted nipple, no mass, no nipple discharge, no skin change and no tenderness. Left breast exhibits no inverted nipple, no mass, no nipple discharge, no skin change and no tenderness. Breasts are symmetrical.  Abdominal: Soft. Bowel sounds are normal. She exhibits no distension and no mass. There is no tenderness. There is no rebound and no guarding.  Musculoskeletal: Normal range of motion. She exhibits no edema and no tenderness.  Lymphadenopathy:    She has no cervical adenopathy.  Neurological: She is alert and oriented to person, place, and time. No cranial nerve deficit. She exhibits normal muscle tone. Coordination normal.  Skin: Skin is warm and dry. No rash noted. She is not diaphoretic. No erythema. No pallor.  Psychiatric: She has a normal mood and affect. Her behavior is normal. Judgment and thought content normal.          Assessment & Plan:   Problem List Items Addressed This Visit     Unprioritized   Hypertension      BP Readings from Last 3 Encounters:  12/17/13 122/78  11/23/12 130/88  09/19/12 136/72   BP well controlled. Renal function today with labs.    Relevant Medications      lisinopril-hydrochlorothiazide (PRINZIDE,ZESTORETIC) 10-12.5 MG per tablet   Obesity (BMI 30-39.9)      Wt Readings from Last 3 Encounters:  12/17/13 204 lb (92.534 kg)  11/23/12 209 lb (94.802 kg)  09/19/12 208 lb (94.348 kg)   Body mass index is 30.56 kg/(m^2). Encouraged healthy diet and exercise.    Routine general medical examination at a health care  facility - Primary     General medical exam including breast exam normal today. PAP and pelvic deferred as normal in 2014. Plan repeat PAP in 2017. Mammogram ordered. Colonoscopy UTD and reviewed. Labs today including CBC, CMP, lipids. Encouraged healthy diet and exercise.    Relevant Orders      MM Digital Screening      TSH       CBC with Differential      Lipid panel      Comprehensive metabolic panel      Microalbumin / creatinine urine ratio      Vit D  25 hydroxy (rtn osteoporosis monitoring)       Return in about 1 year (around 12/18/2014) for Physical.

## 2013-12-18 ENCOUNTER — Telehealth: Payer: Self-pay | Admitting: Internal Medicine

## 2013-12-18 LAB — VITAMIN D 25 HYDROXY (VIT D DEFICIENCY, FRACTURES): VITD: 40.29 ng/mL (ref 30.00–100.00)

## 2013-12-18 NOTE — Telephone Encounter (Signed)
Relevant patient education assigned to patient using Emmi. ° °

## 2014-02-16 ENCOUNTER — Ambulatory Visit (INDEPENDENT_AMBULATORY_CARE_PROVIDER_SITE_OTHER): Payer: PRIVATE HEALTH INSURANCE

## 2014-02-16 DIAGNOSIS — Z23 Encounter for immunization: Secondary | ICD-10-CM

## 2014-04-24 LAB — HM MAMMOGRAPHY

## 2014-12-05 ENCOUNTER — Ambulatory Visit (INDEPENDENT_AMBULATORY_CARE_PROVIDER_SITE_OTHER): Payer: PRIVATE HEALTH INSURANCE | Admitting: Internal Medicine

## 2014-12-05 ENCOUNTER — Encounter: Payer: Self-pay | Admitting: Internal Medicine

## 2014-12-05 VITALS — BP 138/76 | HR 68 | Temp 97.8°F | Ht 69.0 in | Wt 206.0 lb

## 2014-12-05 DIAGNOSIS — Z0000 Encounter for general adult medical examination without abnormal findings: Secondary | ICD-10-CM | POA: Diagnosis not present

## 2014-12-05 DIAGNOSIS — I10 Essential (primary) hypertension: Secondary | ICD-10-CM

## 2014-12-05 LAB — COMPREHENSIVE METABOLIC PANEL
ALK PHOS: 85 U/L (ref 39–117)
ALT: 16 U/L (ref 0–35)
AST: 14 U/L (ref 0–37)
Albumin: 4 g/dL (ref 3.5–5.2)
BUN: 22 mg/dL (ref 6–23)
CO2: 29 meq/L (ref 19–32)
Calcium: 9.8 mg/dL (ref 8.4–10.5)
Chloride: 106 mEq/L (ref 96–112)
Creatinine, Ser: 0.82 mg/dL (ref 0.40–1.20)
GFR: 76.59 mL/min (ref >60.00)
Glucose, Bld: 108 mg/dL — ABNORMAL HIGH (ref 70–99)
Potassium: 4.7 mEq/L (ref 3.5–5.1)
SODIUM: 142 meq/L (ref 135–145)
Total Bilirubin: 0.5 mg/dL (ref 0.2–1.2)
Total Protein: 6.7 g/dL (ref 6.0–8.3)

## 2014-12-05 LAB — LIPID PANEL
CHOL/HDL RATIO: 4
Cholesterol: 238 mg/dL — ABNORMAL HIGH (ref 0–200)
HDL: 57.8 mg/dL (ref >39.00)
LDL Cholesterol: 153 mg/dL — ABNORMAL HIGH (ref 0–99)
NONHDL: 179.7
Triglycerides: 136 mg/dL (ref 0.0–149.0)
VLDL: 27.2 mg/dL (ref 0.0–40.0)

## 2014-12-05 LAB — MICROALBUMIN / CREATININE URINE RATIO
Creatinine,U: 216 mg/dL
Microalb Creat Ratio: 0.5 mg/g (ref 0.0–30.0)
Microalb, Ur: 1.1 mg/dL (ref 0.0–1.9)

## 2014-12-05 LAB — CBC WITH DIFFERENTIAL/PLATELET
BASOS ABS: 0 10*3/uL (ref 0.0–0.1)
Basophils Relative: 0.5 % (ref 0.0–3.0)
EOS PCT: 10 % — AB (ref 0.0–5.0)
Eosinophils Absolute: 0.7 10*3/uL (ref 0.0–0.7)
HEMATOCRIT: 40.6 % (ref 36.0–46.0)
HEMOGLOBIN: 13.7 g/dL (ref 12.0–15.0)
LYMPHS ABS: 2.1 10*3/uL (ref 0.7–4.0)
Lymphocytes Relative: 30.6 % (ref 12.0–46.0)
MCHC: 33.6 g/dL (ref 30.0–36.0)
MCV: 89.2 fl (ref 78.0–100.0)
MONOS PCT: 8.4 % (ref 3.0–12.0)
Monocytes Absolute: 0.6 10*3/uL (ref 0.1–1.0)
Neutro Abs: 3.4 10*3/uL (ref 1.4–7.7)
Neutrophils Relative %: 50.5 % (ref 43.0–77.0)
PLATELETS: 278 10*3/uL (ref 150.0–400.0)
RBC: 4.55 Mil/uL (ref 3.87–5.11)
RDW: 13.3 % (ref 11.5–15.5)
WBC: 6.7 10*3/uL (ref 4.0–10.5)

## 2014-12-05 LAB — HEMOGLOBIN A1C: Hgb A1c MFr Bld: 6.2 % (ref 4.6–6.5)

## 2014-12-05 LAB — VITAMIN D 25 HYDROXY (VIT D DEFICIENCY, FRACTURES): VITD: 31.29 ng/mL (ref 30.00–100.00)

## 2014-12-05 LAB — TSH: TSH: 1.85 u[IU]/mL (ref 0.35–4.50)

## 2014-12-05 MED ORDER — LISINOPRIL-HYDROCHLOROTHIAZIDE 10-12.5 MG PO TABS: 1.0000 | ORAL_TABLET | Freq: Every day | ORAL | Status: DC

## 2014-12-05 MED ORDER — MELOXICAM 15 MG PO TABS: 15.0000 mg | ORAL_TABLET | Freq: Every day | ORAL | Status: DC

## 2014-12-05 NOTE — Addendum Note (Signed)
Addended by: Johnsie Cancel on: 12/05/2014 09:14 AM   Modules accepted: Miquel Dunn

## 2014-12-05 NOTE — Progress Notes (Signed)
Pre visit review using our clinic review tool, if applicable. No additional management support is needed unless otherwise documented below in the visit note. 

## 2014-12-05 NOTE — Patient Instructions (Signed)

## 2014-12-05 NOTE — Assessment & Plan Note (Signed)
General medical exam including breast exam normal today. PAP and pelvic deferred as normal 2014, HPV neg, plan repeat in 2017. Colonoscopy UTD. Immunizations are UTD. Encouraged healthy diet and exercise. Labs as ordered.

## 2014-12-05 NOTE — Progress Notes (Signed)
Subjective:    Patient ID: Olivia Carpenter, female    DOB: [birth year], 54 y.o.   MRN: [id number]  HPI  54YO female presents for annual exam.  Feeling well. No concerns today. Stays active. Tries to follow healthy diet.  Wt Readings from Last 3 Encounters:  12/05/14 206 lb (93.441 kg)  12/17/13 204 lb (92.534 kg)  11/23/12 209 lb (94.802 kg)   BP Readings from Last 3 Encounters:  12/05/14 138/76  12/17/13 122/78  11/23/12 130/88     Past medical, surgical, family and social history per today's encounter.   Review of Systems  Constitutional: Negative for fever, chills, appetite change, fatigue and unexpected weight change.  Eyes: Negative for visual disturbance.  Respiratory: Negative for shortness of breath.   Cardiovascular: Negative for chest pain and leg swelling.  Gastrointestinal: Negative for nausea, vomiting, abdominal pain, diarrhea and constipation.  Musculoskeletal: Negative for myalgias and arthralgias.  Skin: Negative for color change and rash.  Hematological: Negative for adenopathy. Does not bruise/bleed easily.  Psychiatric/Behavioral: Negative for sleep disturbance and dysphoric mood. The patient is not nervous/anxious.        Objective:    BP 138/76 mmHg  Pulse 68  Temp(Src) 97.8 F (36.6 C) (Oral)  Ht 5\' 9"  (1.753 m)  Wt 206 lb (93.441 kg)  BMI 30.41 kg/m2  SpO2 98% Physical Exam  Constitutional: She is oriented to person, place, and time. She appears well-developed and well-nourished. No distress.  HENT:  Head: Normocephalic and atraumatic.  Right Ear: External ear normal.  Left Ear: External ear normal.  Nose: Nose normal.  Mouth/Throat: Oropharynx is clear and moist. No oropharyngeal exudate.  Eyes: Conjunctivae are normal. Pupils are equal, round, and reactive to light. Right eye exhibits no discharge. Left eye exhibits no discharge. No scleral icterus.  Neck: Normal range of motion. Neck supple. No tracheal deviation present. No  thyromegaly present.  Cardiovascular: Normal rate, regular rhythm, normal heart sounds and intact distal pulses.  Exam reveals no gallop and no friction rub.   No murmur heard. Pulmonary/Chest: Effort normal and breath sounds normal. No accessory muscle usage. No tachypnea. No respiratory distress. She has no decreased breath sounds. She has no wheezes. She has no rales. She exhibits no tenderness. Right breast exhibits no inverted nipple, no mass, no nipple discharge, no skin change and no tenderness. Left breast exhibits no inverted nipple, no mass, no nipple discharge, no skin change and no tenderness. Breasts are symmetrical.  Abdominal: Soft. Bowel sounds are normal. She exhibits no distension and no mass. There is no tenderness. There is no rebound and no guarding.  Musculoskeletal: Normal range of motion. She exhibits no edema or tenderness.  Lymphadenopathy:    She has no cervical adenopathy.  Neurological: She is alert and oriented to person, place, and time. No cranial nerve deficit. She exhibits normal muscle tone. Coordination normal.  Skin: Skin is warm and dry. No rash noted. She is not diaphoretic. No erythema. No pallor.  Psychiatric: She has a normal mood and affect. Her behavior is normal. Judgment and thought content normal.          Assessment & Plan:   Problem List Items Addressed This Visit      Unprioritized   Hypertension   Relevant Medications   lisinopril-hydrochlorothiazide (PRINZIDE,ZESTORETIC) 10-12.5 MG per tablet   Routine general medical examination at a health care facility - Primary    General medical exam including breast exam normal today. PAP and pelvic deferred  as normal 2014, HPV neg, plan repeat in 2017. Colonoscopy UTD. Immunizations are UTD. Encouraged healthy diet and exercise. Labs as ordered.      Relevant Orders   MM Digital Screening   CBC with Differential/Platelet   Comprehensive metabolic panel   Lipid panel   Microalbumin /  creatinine urine ratio   Vit D  25 hydroxy (rtn osteoporosis monitoring)   TSH   Hemoglobin A1c       Return in about 1 year (around 12/05/2015) for Physical.

## 2015-04-09 ENCOUNTER — Ambulatory Visit (INDEPENDENT_AMBULATORY_CARE_PROVIDER_SITE_OTHER): Payer: PRIVATE HEALTH INSURANCE

## 2015-04-09 DIAGNOSIS — Z23 Encounter for immunization: Secondary | ICD-10-CM

## 2015-05-09 LAB — HM MAMMOGRAPHY

## 2015-06-10 ENCOUNTER — Encounter: Payer: Self-pay | Admitting: *Deleted

## 2015-10-27 ENCOUNTER — Other Ambulatory Visit: Payer: Self-pay | Admitting: *Deleted

## 2015-10-27 DIAGNOSIS — I10 Essential (primary) hypertension: Secondary | ICD-10-CM

## 2015-10-27 DIAGNOSIS — Z760 Encounter for issue of repeat prescription: Secondary | ICD-10-CM

## 2015-10-27 MED ORDER — LISINOPRIL-HYDROCHLOROTHIAZIDE 10-12.5 MG PO TABS: 1.0000 | ORAL_TABLET | Freq: Every day | ORAL | Status: DC

## 2015-10-27 MED ORDER — MELOXICAM 15 MG PO TABS: 15.0000 mg | ORAL_TABLET | Freq: Every day | ORAL | Status: DC

## 2015-10-27 NOTE — Telephone Encounter (Signed)
Patient has requested to have her lisinopril and meloxicam refilled for six months, until she can be seen by Dr. Diona Browner in February.  Pharmacy [facility] on [address]

## 2015-10-27 NOTE — Telephone Encounter (Signed)
Last OV 12/05/14  Upcoming appointment with you to be cancelled/rescheduled 12/08/15  Upcoming scheduled new patient appointment with Dr. Diona Browner 06/10/16  Medication pended for your approval

## 2015-12-08 ENCOUNTER — Encounter: Payer: PRIVATE HEALTH INSURANCE | Admitting: Internal Medicine

## 2016-02-10 ENCOUNTER — Ambulatory Visit (INDEPENDENT_AMBULATORY_CARE_PROVIDER_SITE_OTHER): Payer: PRIVATE HEALTH INSURANCE

## 2016-02-10 DIAGNOSIS — Z23 Encounter for immunization: Secondary | ICD-10-CM

## 2016-06-08 ENCOUNTER — Telehealth: Payer: Self-pay | Admitting: *Deleted

## 2016-06-08 NOTE — Telephone Encounter (Signed)
Error

## 2016-06-10 ENCOUNTER — Encounter: Payer: Self-pay | Admitting: Family Medicine

## 2016-06-10 ENCOUNTER — Ambulatory Visit (INDEPENDENT_AMBULATORY_CARE_PROVIDER_SITE_OTHER): Payer: PRIVATE HEALTH INSURANCE | Admitting: Family Medicine

## 2016-06-10 VITALS — BP 128/80 | HR 76 | Temp 98.4°F | Ht 69.75 in | Wt 199.5 lb

## 2016-06-10 DIAGNOSIS — J3089 Other allergic rhinitis: Secondary | ICD-10-CM

## 2016-06-10 DIAGNOSIS — Z1159 Encounter for screening for other viral diseases: Secondary | ICD-10-CM

## 2016-06-10 DIAGNOSIS — M159 Polyosteoarthritis, unspecified: Secondary | ICD-10-CM | POA: Diagnosis not present

## 2016-06-10 DIAGNOSIS — I10 Essential (primary) hypertension: Secondary | ICD-10-CM

## 2016-06-10 DIAGNOSIS — E7800 Pure hypercholesterolemia, unspecified: Secondary | ICD-10-CM

## 2016-06-10 DIAGNOSIS — Z8249 Family history of ischemic heart disease and other diseases of the circulatory system: Secondary | ICD-10-CM | POA: Insufficient documentation

## 2016-06-10 DIAGNOSIS — Z85820 Personal history of malignant melanoma of skin: Secondary | ICD-10-CM

## 2016-06-10 DIAGNOSIS — Z0000 Encounter for general adult medical examination without abnormal findings: Secondary | ICD-10-CM | POA: Diagnosis not present

## 2016-06-10 LAB — COMPREHENSIVE METABOLIC PANEL
ALBUMIN: 4.5 g/dL (ref 3.5–5.2)
ALT: 20 U/L (ref 0–35)
AST: 21 U/L (ref 0–37)
Alkaline Phosphatase: 76 U/L (ref 39–117)
BUN: 17 mg/dL (ref 6–23)
CALCIUM: 10.3 mg/dL (ref 8.4–10.5)
CHLORIDE: 102 meq/L (ref 96–112)
CO2: 33 meq/L — AB (ref 19–32)
CREATININE: 0.78 mg/dL (ref 0.40–1.20)
GFR: 80.7 mL/min (ref >60.00)
Glucose, Bld: 96 mg/dL (ref 70–99)
POTASSIUM: 4.4 meq/L (ref 3.5–5.1)
Sodium: 141 mEq/L (ref 135–145)
Total Bilirubin: 0.5 mg/dL (ref 0.2–1.2)
Total Protein: 7.7 g/dL (ref 6.0–8.3)

## 2016-06-10 LAB — LIPID PANEL
CHOL/HDL RATIO: 4
Cholesterol: 252 mg/dL — ABNORMAL HIGH (ref 0–200)
HDL: 62.4 mg/dL (ref >39.00)
LDL CALC: 158 mg/dL — AB (ref 0–99)
NonHDL: 189.15
TRIGLYCERIDES: 154 mg/dL — AB (ref 0.0–149.0)
VLDL: 30.8 mg/dL (ref 0.0–40.0)

## 2016-06-10 NOTE — Assessment & Plan Note (Signed)
Due for re-eval. Encouraged exercise, weight loss, healthy eating habits.

## 2016-06-10 NOTE — Patient Instructions (Addendum)
Call to schedule mammogram on your own.  Please stop at the front desk or lab to set up referral or to have labs drawn.

## 2016-06-10 NOTE — Assessment & Plan Note (Signed)
Followed by Dr. Nehemiah Massed every 6 months.

## 2016-06-10 NOTE — Assessment & Plan Note (Signed)
Well controlled with allergy chots and allegra.

## 2016-06-10 NOTE — Progress Notes (Signed)
Pre visit review using our clinic review tool, if applicable. No additional management support is needed unless otherwise documented below in the visit note. 

## 2016-06-10 NOTE — Assessment & Plan Note (Signed)
Well-controlled on current meds 

## 2016-06-10 NOTE — Progress Notes (Signed)
Subjective:    Patient ID: Olivia Carpenter, female    DOB: [birth year], 58 y.o.   MRN: [id number]  HPI   56 year old female presents to establish care. She is also due for CPX.  Previously PCP Dr. Gilford Rile.  Last CPX 12/2014.    Doing well overall.  Hypertension:    Well controlled on lisinopril HCTZ. BP Readings from Last 3 Encounters:  06/10/16 128/80  12/05/14 138/76  12/17/13 122/78  Using medication without problems or lightheadedness:  none Chest pain with exertion:none Edema:none Short of breath: none Average home BPs: not checking. Other issues:  Elevated Cholesterol:  Due for re-eval. On no med. Lab Results  Component Value Date   CHOL 238 (H) 12/05/2014   HDL 57.80 12/05/2014   LDLCALC 153 (H) 12/05/2014   LDLDIRECT 142.8 11/16/2012   TRIG 136.0 12/05/2014   CHOLHDL 4 12/05/2014  Using medications without problems: Muscle aches: none Diet compliance: moderate, water intake  Exercise: [place],  No regular exercise. Other complaints:   Well controlled allergic rhinitis with shots and allegra Dr. Tami Ribas.  Osteoarthritis, moderate control with meloxicam.  Wt Readings from Last 3 Encounters:  06/10/16 199 lb 8 oz (90.5 kg)  12/05/14 206 lb (93.4 kg)  12/17/13 204 lb (92.5 kg)  Body mass index is 28.83 kg/m.   Social History /Family History/Past Medical History reviewed and updated if needed.   Review of Systems  Constitutional: Negative for fatigue and fever.  HENT: Negative for congestion.   Eyes: Negative for pain.  Respiratory: Negative for cough and shortness of breath.   Cardiovascular: Negative for chest pain, palpitations and leg swelling.  Gastrointestinal: Negative for abdominal pain.  Genitourinary: Negative for dysuria and vaginal bleeding.  Musculoskeletal: Positive for arthralgias. Negative for back pain.  Neurological: Negative for syncope, light-headedness and headaches.  Psychiatric/Behavioral: Negative for dysphoric mood.        Objective:   Physical Exam  Constitutional: Vital signs are normal. She appears well-developed and well-nourished. She is cooperative.  Non-toxic appearance. She does not appear ill. No distress.  HENT:  Head: Normocephalic.  Right Ear: Hearing, tympanic membrane, external ear and ear canal normal.  Left Ear: Hearing, tympanic membrane, external ear and ear canal normal.  Nose: Nose normal.  Eyes: Conjunctivae, EOM and lids are normal. Pupils are equal, round, and reactive to light. Lids are everted and swept, no foreign bodies found.  Neck: Trachea normal and normal range of motion. Neck supple. Carotid bruit is not present. No thyroid mass and no thyromegaly present.  Cardiovascular: Normal rate, regular rhythm, S1 normal, S2 normal, normal heart sounds and intact distal pulses.  Exam reveals no gallop.   No murmur heard. Pulmonary/Chest: Effort normal and breath sounds normal. No respiratory distress. She has no wheezes. She has no rhonchi. She has no rales.  Abdominal: Soft. Normal appearance and bowel sounds are normal. She exhibits no distension, no fluid wave, no abdominal bruit and no mass. There is no hepatosplenomegaly. There is no tenderness. There is no rebound, no guarding and no CVA tenderness. No hernia.  Lymphadenopathy:    She has no cervical adenopathy.    She has no axillary adenopathy.  Neurological: She is alert. She has normal strength. No cranial nerve deficit or sensory deficit.  Skin: Skin is warm, dry and intact. No rash noted.  Psychiatric: Her speech is normal and behavior is normal. Judgment normal. Her mood appears not anxious. Cognition and memory are normal. She does  not exhibit a depressed mood.          Assessment & Plan:  The patient's preventative maintenance and recommended screening tests for an annual wellness exam were reviewed in full today. Brought up to date unless services declined.  Counselled on the importance of diet, exercise,  and its role in overall health and mortality. The patient's FH and SH was reviewed, including their home life, tobacco status, and drug and alcohol status.   Vaccines: Uptodate. Pap/DVE:  Nml , neg HPV 2014, plan repeat in 2019. DVE not indicated. Mammo:  Due.  Not indicated  breast exam. Bone Density: Plan age age 66. Colon: Dr. Eugenia Pancoast, family history brother,  Due in 2019  Smoking Status: None  Hep C:  Due  HIV screen:

## 2016-06-10 NOTE — Addendum Note (Signed)
Addended by: Marchia Bond on: 06/10/2016 09:43 AM   Modules accepted: Orders

## 2016-06-10 NOTE — Assessment & Plan Note (Signed)
Stable control on meloxicam daily.

## 2016-06-11 ENCOUNTER — Encounter: Payer: Self-pay | Admitting: *Deleted

## 2016-06-11 LAB — HEPATITIS C ANTIBODY: HCV AB: NEGATIVE

## 2016-06-15 ENCOUNTER — Encounter: Payer: Self-pay | Admitting: Family Medicine

## 2017-02-18 ENCOUNTER — Telehealth: Payer: Self-pay

## 2017-02-18 NOTE — Telephone Encounter (Signed)
Pt left v/m; pt established care 06/10/16 and pt was told had elevated cholesterol; pt request order to have labs done at [facility] now to see if any improvements in cholesterol labs with what pt is doing with diet. Please advise.

## 2017-02-21 ENCOUNTER — Telehealth: Payer: Self-pay | Admitting: *Deleted

## 2017-02-21 NOTE — Telephone Encounter (Signed)
Copied from [place] #[id number]. Topic: General - Other >> Feb 21, 2017 10:56 AM Robina Ade, Helene Kelp D wrote: Patient needs to talk to [facility] or provider about the shingle vaccine and the flu shot. Please call patient back ,thanks.  Spoke with Horris Latino.  She had called because she was looking to get her flu vaccine and shingle vaccine due to her husband was diagnosed with Shingles over the weekend.  She states she went to [facility] on [address] and got her Shingrix and flu vaccine today.

## 2017-02-21 NOTE — Telephone Encounter (Signed)
Okay, will order.. Pt needs to make lab appointment and order request will be sent to me.

## 2017-02-21 NOTE — Telephone Encounter (Signed)
Lab appointment scheduled for 02/22/2017 at 8:05 am.

## 2017-02-22 ENCOUNTER — Other Ambulatory Visit (INDEPENDENT_AMBULATORY_CARE_PROVIDER_SITE_OTHER): Payer: PRIVATE HEALTH INSURANCE

## 2017-02-22 ENCOUNTER — Telehealth: Payer: Self-pay | Admitting: Family Medicine

## 2017-02-22 DIAGNOSIS — E7800 Pure hypercholesterolemia, unspecified: Secondary | ICD-10-CM

## 2017-02-22 LAB — COMPREHENSIVE METABOLIC PANEL
ALT: 15 U/L (ref 0–35)
AST: 14 U/L (ref 0–37)
Albumin: 4.2 g/dL (ref 3.5–5.2)
Alkaline Phosphatase: 76 U/L (ref 39–117)
BILIRUBIN TOTAL: 0.5 mg/dL (ref 0.2–1.2)
BUN: 16 mg/dL (ref 6–23)
CALCIUM: 9.8 mg/dL (ref 8.4–10.5)
CHLORIDE: 103 meq/L (ref 96–112)
CO2: 31 meq/L (ref 19–32)
CREATININE: 0.79 mg/dL (ref 0.40–1.20)
GFR: 79.33 mL/min (ref >60.00)
GLUCOSE: 110 mg/dL — AB (ref 70–99)
Potassium: 4.1 mEq/L (ref 3.5–5.1)
SODIUM: 142 meq/L (ref 135–145)
Total Protein: 7.2 g/dL (ref 6.0–8.3)

## 2017-02-22 LAB — LIPID PANEL
CHOL/HDL RATIO: 4
Cholesterol: 208 mg/dL — ABNORMAL HIGH (ref 0–200)
HDL: 56.9 mg/dL (ref >39.00)
LDL CALC: 127 mg/dL — AB (ref 0–99)
NONHDL: 150.7
Triglycerides: 118 mg/dL (ref 0.0–149.0)
VLDL: 23.6 mg/dL (ref 0.0–40.0)

## 2017-02-22 NOTE — Telephone Encounter (Signed)
-----   Message from Ellamae Sia sent at 02/21/2017  5:13 PM EDT ----- Regarding: Lab orders for Tuesday, 10.23.18 Lab orders, please.

## 2017-03-17 ENCOUNTER — Telehealth: Payer: Self-pay | Admitting: Family Medicine

## 2017-03-17 ENCOUNTER — Other Ambulatory Visit: Payer: Self-pay | Admitting: Family Medicine

## 2017-03-17 DIAGNOSIS — Z760 Encounter for issue of repeat prescription: Secondary | ICD-10-CM

## 2017-03-17 MED ORDER — LISINOPRIL-HYDROCHLOROTHIAZIDE 10-12.5 MG PO TABS: 1.0000 | ORAL_TABLET | Freq: Every day | ORAL | 0 refills | Status: DC

## 2017-03-17 MED ORDER — LISINOPRIL-HYDROCHLOROTHIAZIDE 10-12.5 MG PO TABS: 1.0000 | ORAL_TABLET | Freq: Every day | ORAL | 1 refills | Status: AC

## 2017-03-17 NOTE — Telephone Encounter (Signed)
Copied from [place] [id number]. Topic: General - Other >> Mar 17, 2017 10:52 AM Scherrie Gerlach wrote: Pt request refill of lisinopril-hydrochlorothiazide (PRINZIDE,ZESTORETIC) 10-12.5 MG tablet meloxicam (MOBIC) 15 MG tablet  Pt had switched  to Dr Diona Browner, and Dr Diona Browner has never filled this rx before, under previous dr name.  [facility] [address] [place] [phone] (Phone) [phone] (Fax)

## 2017-03-17 NOTE — Telephone Encounter (Signed)
Last office visit 06/10/2016.  Last refilled 10/27/2015 for #180 with 3 refills by Dr. Gilford Rile.  Ok to refill?

## 2017-03-17 NOTE — Progress Notes (Signed)
Refilled lisinoprill HCTZ BP Readings from Last 3 Encounters:  06/10/16 128/80  12/05/14 138/76  12/17/13 122/78

## 2017-06-07 ENCOUNTER — Other Ambulatory Visit: Payer: PRIVATE HEALTH INSURANCE

## 2017-06-14 ENCOUNTER — Encounter: Payer: PRIVATE HEALTH INSURANCE | Admitting: Family Medicine

## 2017-06-30 ENCOUNTER — Other Ambulatory Visit: Payer: Self-pay | Admitting: Physician Assistant

## 2017-06-30 DIAGNOSIS — M25561 Pain in right knee: Secondary | ICD-10-CM

## 2017-06-30 DIAGNOSIS — M2391 Unspecified internal derangement of right knee: Secondary | ICD-10-CM

## 2017-07-15 ENCOUNTER — Ambulatory Visit
Admission: RE | Admit: 2017-07-15 | Discharge: 2017-07-15 | Disposition: A | Discharge status: Home / Self Care | Payer: PRIVATE HEALTH INSURANCE | Source: Ambulatory Visit | Attending: Physician Assistant | Admitting: Physician Assistant

## 2017-07-15 DIAGNOSIS — M2391 Unspecified internal derangement of right knee: Secondary | ICD-10-CM | POA: Diagnosis present

## 2017-07-15 DIAGNOSIS — S83231A Complex tear of medial meniscus, current injury, right knee, initial encounter: Secondary | ICD-10-CM | POA: Insufficient documentation

## 2017-07-15 DIAGNOSIS — M25561 Pain in right knee: Secondary | ICD-10-CM | POA: Diagnosis present

## 2017-07-15 DIAGNOSIS — M1711 Unilateral primary osteoarthritis, right knee: Secondary | ICD-10-CM | POA: Diagnosis not present

## 2017-08-15 ENCOUNTER — Encounter: Payer: Self-pay | Admitting: Gastroenterology

## 2017-08-24 ENCOUNTER — Encounter
Admission: RE | Admit: 2017-08-24 | Discharge: 2017-08-24 | Disposition: A | Discharge status: Home / Self Care | Payer: PRIVATE HEALTH INSURANCE | Source: Ambulatory Visit | Attending: Orthopedic Surgery | Admitting: Orthopedic Surgery

## 2017-08-24 ENCOUNTER — Other Ambulatory Visit: Payer: Self-pay

## 2017-08-24 HISTORY — DX: Unspecified osteoarthritis, unspecified site: M19.90

## 2017-08-24 NOTE — Patient Instructions (Signed)
Your procedure is scheduled on: 08-31-17 [facility] Report to Same Day Surgery 2nd floor medical mall [facility] Entrance-take elevator on left to 2nd floor.  Check in with surgery information desk.) To find out your arrival time please call [phone] between 1PM - 3PM on 08-30-17 TUESDAY  Remember: Instructions that are not followed completely may result in serious medical risk, up to and including death, or upon the discretion of your surgeon and anesthesiologist your surgery may need to be rescheduled.    _x___ 1. Do not eat food after midnight the night before your procedure. NO GUM OR CANDY AFTER MIDNIGHT.  You may drink clear liquids up to 2 hours before you are scheduled to arrive at the hospital for your procedure.  Do not drink clear liquids within 2 hours of your scheduled arrival to the hospital.  Clear liquids include  --Water or Apple juice without pulp  --Clear carbohydrate beverage such as ClearFast or Gatorade  --Black Coffee or Clear Tea (No milk, no creamers, do not add anything to the coffee or Tea    __x__ 2. No Alcohol for 24 hours before or after surgery.   __x__3. No Smoking or e-cigarettes for 24 prior to surgery.  Do not use any chewable tobacco products for at least 6 hour prior to surgery   ____  4. Bring all medications with you on the day of surgery if instructed.    __x__ 5. Notify your doctor if there is any change in your medical condition     (cold, fever, infections).    x___6. On the morning of surgery brush your teeth with toothpaste and water.  You may rinse your mouth with mouth wash if you wish.  Do not swallow any toothpaste or mouthwash.   Do not wear jewelry, make-up, hairpins, clips or nail polish.  Do not wear lotions, powders, or perfumes. You may wear deodorant.  Do not shave 48 hours prior to surgery. Men may shave face and neck.  Do not bring valuables to the hospital.    [facility] is not responsible for any belongings or  valuables.               Contacts, dentures or bridgework may not be worn into surgery.  Leave your suitcase in the car. After surgery it may be brought to your room.  For patients admitted to the hospital, discharge time is determined by your treatment team.  _  Patients discharged the day of surgery will not be allowed to drive home.  You will need someone to drive you home and stay with you the night of your procedure.    Please read over the following fact sheets that you were given:   [facility] Preparing for Surgery and or MRSA Information   ____ Take anti-hypertensive listed below, cardiac, seizure, asthma, anti-reflux and psychiatric medicines. These include:  1. NONE  2.  3.  4.  5.  6.  ____Fleets enema or Magnesium Citrate as directed.   _x___ Use CHG Soap or sage wipes as directed on instruction sheet   ____ Use inhalers on the day of surgery and bring to hospital day of surgery  ____ Stop Metformin and Janumet 2 days prior to surgery.    ____ Take 1/2 of usual insulin dose the night before surgery and none on the morning surgery.   ____ Follow recommendations from Cardiologist, Pulmonologist or PCP regarding stopping Aspirin, Coumadin, Plavix ,Eliquis, Effient, or Pradaxa, and Pletal.  X____Stop Anti-inflammatories  such as Advil, Aleve, IBUPROFEN, Motrin, Naproxen, Naprosyn, Goodies powders or aspirin products NOW-OK to take Tylenol    _x___ Stop supplements until after surgery-STOP GLUCOSAMINE NOW-MAY RESUME AFTER SURGERY   ____ Bring C-Pap to the hospital.

## 2017-08-25 ENCOUNTER — Encounter
Admission: RE | Admit: 2017-08-25 | Discharge: 2017-08-25 | Disposition: A | Discharge status: Home / Self Care | Payer: PRIVATE HEALTH INSURANCE | Source: Ambulatory Visit | Attending: Orthopedic Surgery | Admitting: Orthopedic Surgery

## 2017-08-25 DIAGNOSIS — Z01818 Encounter for other preprocedural examination: Secondary | ICD-10-CM | POA: Diagnosis not present

## 2017-08-25 DIAGNOSIS — I10 Essential (primary) hypertension: Secondary | ICD-10-CM | POA: Diagnosis not present

## 2017-08-25 LAB — POTASSIUM: Potassium: 4.2 mmol/L (ref 3.5–5.1)

## 2017-08-29 ENCOUNTER — Encounter: Payer: Self-pay | Admitting: Gastroenterology

## 2017-08-31 ENCOUNTER — Ambulatory Visit: Payer: PRIVATE HEALTH INSURANCE

## 2017-08-31 ENCOUNTER — Encounter
Admission: RE | Disposition: A | Discharge status: Home / Self Care | Payer: Self-pay | Source: Ambulatory Visit | Attending: Orthopedic Surgery

## 2017-08-31 ENCOUNTER — Ambulatory Visit
Admission: RE | Admit: 2017-08-31 | Discharge: 2017-08-31 | Disposition: A | Discharge status: Home / Self Care | Payer: PRIVATE HEALTH INSURANCE | Source: Ambulatory Visit | Attending: Orthopedic Surgery | Admitting: Orthopedic Surgery

## 2017-08-31 ENCOUNTER — Encounter: Payer: Self-pay | Admitting: Orthopedic Surgery

## 2017-08-31 DIAGNOSIS — E785 Hyperlipidemia, unspecified: Secondary | ICD-10-CM | POA: Insufficient documentation

## 2017-08-31 DIAGNOSIS — Y9301 Activity, walking, marching and hiking: Secondary | ICD-10-CM | POA: Diagnosis not present

## 2017-08-31 DIAGNOSIS — S83241A Other tear of medial meniscus, current injury, right knee, initial encounter: Secondary | ICD-10-CM | POA: Diagnosis not present

## 2017-08-31 DIAGNOSIS — I10 Essential (primary) hypertension: Secondary | ICD-10-CM | POA: Diagnosis not present

## 2017-08-31 DIAGNOSIS — X501XXA Overexertion from prolonged static or awkward postures, initial encounter: Secondary | ICD-10-CM | POA: Diagnosis not present

## 2017-08-31 DIAGNOSIS — Z85820 Personal history of malignant melanoma of skin: Secondary | ICD-10-CM | POA: Diagnosis not present

## 2017-08-31 DIAGNOSIS — Z79899 Other long term (current) drug therapy: Secondary | ICD-10-CM | POA: Insufficient documentation

## 2017-08-31 DIAGNOSIS — Z9889 Other specified postprocedural states: Secondary | ICD-10-CM

## 2017-08-31 DIAGNOSIS — Y929 Unspecified place or not applicable: Secondary | ICD-10-CM | POA: Diagnosis not present

## 2017-08-31 HISTORY — PX: KNEE ARTHROSCOPY WITH MEDIAL MENISECTOMY: SHX5651

## 2017-08-31 SURGERY — ARTHROSCOPY, KNEE, WITH MEDIAL MENISCECTOMY
Anesthesia: General | Site: Knee | Laterality: Right | Wound class: "Clean "

## 2017-08-31 MED ORDER — ONDANSETRON HCL 4 MG/2ML IJ SOLN
INTRAMUSCULAR | Status: DC | PRN
  Administered 2017-08-31: 4 mg via INTRAVENOUS

## 2017-08-31 MED ORDER — BUPIVACAINE-EPINEPHRINE 0.25% -1:200000 IJ SOLN
INTRAMUSCULAR | Status: DC | PRN
  Administered 2017-08-31: 30 mL

## 2017-08-31 MED ORDER — LACTATED RINGERS IV SOLN: INTRAVENOUS | Status: DC

## 2017-08-31 MED ORDER — MIDAZOLAM HCL 2 MG/2ML IJ SOLN
INTRAMUSCULAR | Status: AC
  Filled 2017-08-31: qty 2

## 2017-08-31 MED ORDER — HYDROCODONE-ACETAMINOPHEN 5-325 MG PO TABS
ORAL_TABLET | ORAL | Status: DC
Start: 2017-08-31 — End: 2017-08-31
  Filled 2017-08-31: qty 1

## 2017-08-31 MED ORDER — FAMOTIDINE 20 MG PO TABS
20.0000 mg | ORAL_TABLET | Freq: Once | ORAL | Status: AC
  Administered 2017-08-31: 20 mg via ORAL

## 2017-08-31 MED ORDER — ACETAMINOPHEN 10 MG/ML IV SOLN
INTRAVENOUS | Status: DC | PRN
  Administered 2017-08-31: 1000 mg via INTRAVENOUS

## 2017-08-31 MED ORDER — FENTANYL CITRATE (PF) 100 MCG/2ML IJ SOLN
INTRAMUSCULAR | Status: AC
  Administered 2017-08-31: 25 ug via INTRAVENOUS
  Filled 2017-08-31: qty 2

## 2017-08-31 MED ORDER — DEXAMETHASONE SODIUM PHOSPHATE 10 MG/ML IJ SOLN
INTRAMUSCULAR | Status: AC
  Filled 2017-08-31: qty 1

## 2017-08-31 MED ORDER — CHLORHEXIDINE GLUCONATE 4 % EX LIQD
60.0000 mL | Freq: Once | CUTANEOUS | Status: AC
  Administered 2017-08-31: 4 via TOPICAL

## 2017-08-31 MED ORDER — LIDOCAINE HCL (PF) 2 % IJ SOLN
INTRAMUSCULAR | Status: AC
  Filled 2017-08-31: qty 10

## 2017-08-31 MED ORDER — LACTATED RINGERS IV SOLN
INTRAVENOUS | Status: DC | PRN
  Administered 2017-08-31 (×2): via INTRAVENOUS

## 2017-08-31 MED ORDER — ONDANSETRON HCL 4 MG/2ML IJ SOLN: 4.0000 mg | Freq: Once | INTRAMUSCULAR | Status: DC | PRN

## 2017-08-31 MED ORDER — HYDROCODONE-ACETAMINOPHEN 5-325 MG PO TABS
1.0000 | ORAL_TABLET | ORAL | Status: DC | PRN
  Administered 2017-08-31: 1 via ORAL

## 2017-08-31 MED ORDER — FENTANYL CITRATE (PF) 100 MCG/2ML IJ SOLN
INTRAMUSCULAR | Status: DC | PRN
  Administered 2017-08-31 (×4): 25 ug via INTRAVENOUS

## 2017-08-31 MED ORDER — HYDROCODONE-ACETAMINOPHEN 5-325 MG PO TABS: 1.0000 | ORAL_TABLET | ORAL | 0 refills | Status: DC | PRN

## 2017-08-31 MED ORDER — ACETAMINOPHEN 10 MG/ML IV SOLN
INTRAVENOUS | Status: AC
  Filled 2017-08-31: qty 100

## 2017-08-31 MED ORDER — FENTANYL CITRATE (PF) 100 MCG/2ML IJ SOLN
25.0000 ug | INTRAMUSCULAR | Status: AC | PRN
  Administered 2017-08-31 (×6): 25 ug via INTRAVENOUS

## 2017-08-31 MED ORDER — BUPIVACAINE-EPINEPHRINE (PF) 0.25% -1:200000 IJ SOLN
INTRAMUSCULAR | Status: AC
  Filled 2017-08-31: qty 30

## 2017-08-31 MED ORDER — FAMOTIDINE 20 MG PO TABS
ORAL_TABLET | ORAL | Status: AC
  Administered 2017-08-31: 20 mg via ORAL
  Filled 2017-08-31: qty 1

## 2017-08-31 MED ORDER — LIDOCAINE HCL (CARDIAC) PF 100 MG/5ML IV SOSY
PREFILLED_SYRINGE | INTRAVENOUS | Status: DC | PRN
  Administered 2017-08-31: 50 mg via INTRAVENOUS

## 2017-08-31 MED ORDER — MIDAZOLAM HCL 2 MG/2ML IJ SOLN
INTRAMUSCULAR | Status: DC | PRN
  Administered 2017-08-31: 2 mg via INTRAVENOUS

## 2017-08-31 MED ORDER — DEXAMETHASONE SODIUM PHOSPHATE 10 MG/ML IJ SOLN
INTRAMUSCULAR | Status: DC | PRN
  Administered 2017-08-31: 5 mg via INTRAVENOUS

## 2017-08-31 MED ORDER — EPHEDRINE SULFATE 50 MG/ML IJ SOLN
INTRAMUSCULAR | Status: AC
  Filled 2017-08-31: qty 1

## 2017-08-31 MED ORDER — FENTANYL CITRATE (PF) 100 MCG/2ML IJ SOLN
INTRAMUSCULAR | Status: AC
  Filled 2017-08-31: qty 2

## 2017-08-31 MED ORDER — PROPOFOL 10 MG/ML IV BOLUS
INTRAVENOUS | Status: DC | PRN
  Administered 2017-08-31: 200 mg via INTRAVENOUS
  Administered 2017-08-31: 50 mg via INTRAVENOUS

## 2017-08-31 MED ORDER — EPHEDRINE SULFATE 50 MG/ML IJ SOLN
INTRAMUSCULAR | Status: DC | PRN
  Administered 2017-08-31: 5 mg via INTRAVENOUS

## 2017-08-31 MED ORDER — MORPHINE SULFATE (PF) 4 MG/ML IV SOLN
INTRAVENOUS | Status: AC
  Filled 2017-08-31: qty 1

## 2017-08-31 MED ORDER — PROPOFOL 10 MG/ML IV BOLUS
INTRAVENOUS | Status: AC
  Filled 2017-08-31: qty 40

## 2017-08-31 MED ORDER — PHENYLEPHRINE HCL 10 MG/ML IJ SOLN
INTRAMUSCULAR | Status: DC | PRN
  Administered 2017-08-31 (×2): 50 ug via INTRAVENOUS
  Administered 2017-08-31: 100 ug via INTRAVENOUS
  Administered 2017-08-31: 50 ug via INTRAVENOUS

## 2017-08-31 MED ORDER — MORPHINE SULFATE 4 MG/ML IJ SOLN
INTRAMUSCULAR | Status: DC | PRN
  Administered 2017-08-31: 4 mg via INTRAVENOUS

## 2017-08-31 MED ORDER — ONDANSETRON HCL 4 MG/2ML IJ SOLN
INTRAMUSCULAR | Status: AC
  Filled 2017-08-31: qty 2

## 2017-08-31 SURGICAL SUPPLY — 22 items
BLADE SHAVER 4.5 DBL SERAT CV (CUTTER) IMPLANT
CUFF TOURN 24 STER (MISCELLANEOUS) IMPLANT
CUFF TOURN 30 STER DUAL PORT (MISCELLANEOUS) IMPLANT
DRSG DERMACEA 8X12 NADH (GAUZE/BANDAGES/DRESSINGS) ×4 IMPLANT
DURAPREP 26ML APPLICATOR (WOUND CARE) ×8 IMPLANT
GAUZE SPONGE 4X4 12PLY STRL (GAUZE/BANDAGES/DRESSINGS) ×4 IMPLANT
GLOVE BIOGEL M STRL SZ7.5 (GLOVE) ×4 IMPLANT
GLOVE INDICATOR 8.0 STRL GRN (GLOVE) ×4 IMPLANT
GOWN STRL REUS W/ TWL LRG LVL3 (GOWN DISPOSABLE) ×4 IMPLANT
GOWN STRL REUS W/TWL LRG LVL3 (GOWN DISPOSABLE) ×4
IV LACTATED RINGER IRRG 3000ML (IV SOLUTION) ×12
IV LR IRRIG 3000ML ARTHROMATIC (IV SOLUTION) ×12 IMPLANT
KIT TURNOVER KIT A (KITS) ×4 IMPLANT
MANIFOLD NEPTUNE II (INSTRUMENTS) ×4 IMPLANT
PACK ARTHROSCOPY KNEE (MISCELLANEOUS) ×4 IMPLANT
SET TUBE SUCT SHAVER OUTFL 24K (TUBING) ×4 IMPLANT
SET TUBE TIP INTRA-ARTICULAR (MISCELLANEOUS) ×4 IMPLANT
SUT ETHILON 3-0 FS-10 30 BLK (SUTURE) ×4
SUTURE EHLN 3-0 FS-10 30 BLK (SUTURE) ×2 IMPLANT
TUBING ARTHRO INFLOW-ONLY STRL (TUBING) ×4 IMPLANT
WAND HAND CNTRL MULTIVAC 50 (MISCELLANEOUS) ×4 IMPLANT
WRAP KNEE W/COLD PACKS 25.5X14 (SOFTGOODS) ×4 IMPLANT

## 2017-08-31 NOTE — Discharge Instructions (Signed)
AMBULATORY SURGERY  °DISCHARGE INSTRUCTIONS ° ° °1) The drugs that you were given will stay in your system until tomorrow so for the next 24 hours you should not: ° °A) Drive an automobile °B) Make any legal decisions °C) Drink any alcoholic beverage ° ° °2) You may resume regular meals tomorrow.  Today it is better to start with liquids and gradually work up to solid foods. ° °You may eat anything you prefer, but it is better to start with liquids, then soup and crackers, and gradually work up to solid foods. ° ° °3) Please notify your doctor immediately if you have any unusual bleeding, trouble breathing, redness and pain at the surgery site, drainage, fever, or pain not relieved by medication. ° ° ° °4) Additional Instructions: ° ° ° ° ° ° ° °Please contact your physician with any problems or Same Day Surgery at [phone], Monday through Friday 6 am to 4 pm, or [place] [facility] number at [phone]. ° ° °Instructions after Knee Arthroscopy  ° ° James P. Hooten, Jr., M.D.    ° Dept. of Orthopaedics & Sports Medicine ° [facility] ° [address] ° [place], [place]  [address] ° ° Phone: [phone]   Fax: [phone] ° ° °DIET: °• Drink plenty of non-alcoholic fluids & begin a light diet. °• Resume your normal diet the day after surgery. ° °ACTIVITY:  °• You may use crutches or a walker with weight-bearing as tolerated, unless instructed otherwise. °• You may wean yourself off of the walker or crutches as tolerated.  °• Begin doing gentle exercises. Exercising will reduce the pain and swelling, increase motion, and prevent muscle weakness.   °• Avoid strenuous activities or athletics for a minimum of 4-6 weeks after arthroscopic surgery. °• Do not drive or operate any equipment until instructed. ° °WOUND CARE:  °• Place one to two pillows under the knee the first day or two when sitting or lying.  °• Continue to use the ice packs periodically to reduce pain and swelling. °• The small  incisions in your knee are closed with nylon stitches. The stitches will be removed in the office. °• The bulky dressing may be removed on the second day after surgery. DO NOT TOUCH THE STITCHES. Put a Band-Aid over each stitch. Do NOT use any ointments or creams on the incisions.  °• You may bathe or shower after the stitches are removed at the first office visit following surgery. ° °MEDICATIONS: °• You may resume your regular medications. °• Please take the pain medication as prescribed. °• Do not take pain medication on an empty stomach. °• Do not drive or drink alcoholic beverages when taking pain medications. ° °CALL THE OFFICE FOR: °• Temperature above 101 degrees °• Excessive bleeding or drainage on the dressing. °• Excessive swelling, coldness, or paleness of the toes. °• Persistent nausea and vomiting. ° °FOLLOW-UP:  °• You should have an appointment to return to the office in 7-10 days after surgery.  °  °

## 2017-08-31 NOTE — H&P (Signed)
The patient has been re-examined, and the chart reviewed, and there have been no interval changes to the documented history and physical.    The risks, benefits, and alternatives have been discussed at length. The patient expressed understanding of the risks benefits and agreed with plans for surgical intervention.  Olivia Carpenter, Jr. M.D.    

## 2017-08-31 NOTE — Anesthesia Procedure Notes (Signed)
Procedure Name: LMA Insertion Date/Time: 08/31/2017 2:18 PM Performed by: Molli Barrows, MD Pre-anesthesia Checklist: Patient identified, Emergency Drugs available, Suction available, Patient being monitored and Timeout performed Patient Re-evaluated:Patient Re-evaluated prior to induction Oxygen Delivery Method: Circle system utilized Preoxygenation: Pre-oxygenation with 100% oxygen Induction Type: IV induction Ventilation: Mask ventilation without difficulty LMA: LMA inserted LMA Size: 4.5 Dental Injury: Teeth and Oropharynx as per pre-operative assessment

## 2017-08-31 NOTE — Transfer of Care (Signed)
Immediate Anesthesia Transfer of Care Note  Patient: Olivia Carpenter  Procedure(s) Performed: KNEE ARTHROSCOPY WITH MEDIAL MENISECTOMY (Right Knee)  Patient Location: PACU  Anesthesia Type:General  Level of Consciousness: awake, alert  and oriented  Airway & Oxygen Therapy: Patient Spontanous Breathing and Patient connected to face mask oxygen  Post-op Assessment: Report given to RN and Post -op Vital signs reviewed and stable  Post vital signs: Reviewed and stable  Last Vitals:  Vitals Value Taken Time  BP 121/66 08/31/2017  3:47 PM  Temp    Pulse 78 08/31/2017  3:48 PM  Resp 14 08/31/2017  3:48 PM  SpO2 100 % 08/31/2017  3:48 PM  Vitals shown include unvalidated device data.  Last Pain:  Vitals:   08/31/17 1320  TempSrc: Tympanic  PainSc: 0-No pain         Complications: No apparent anesthesia complications

## 2017-08-31 NOTE — Anesthesia Post-op Follow-up Note (Signed)
Anesthesia QCDR form completed.        

## 2017-08-31 NOTE — Anesthesia Preprocedure Evaluation (Signed)
Anesthesia Evaluation  Patient identified by MRN, date of birth, ID band Patient awake    Reviewed: Allergy & Precautions, H&P , NPO status , Patient's Chart, lab work & pertinent test results, reviewed documented beta blocker date and time   Airway Mallampati: II  TM Distance: >3 FB Neck ROM: full    Dental  (+) Teeth Intact   Pulmonary neg pulmonary ROS,    Pulmonary exam normal        Cardiovascular Exercise Tolerance: Good hypertension, On Medications negative cardio ROS Normal cardiovascular exam Rate:Normal     Neuro/Psych negative neurological ROS  negative psych ROS   GI/Hepatic negative GI ROS, Neg liver ROS,   Endo/Other  negative endocrine ROS  Renal/GU negative Renal ROS  negative genitourinary   Musculoskeletal   Abdominal   Peds  Hematology negative hematology ROS (+)   Anesthesia Other Findings   Reproductive/Obstetrics negative OB ROS                             Anesthesia Physical Anesthesia Plan  ASA: II  Anesthesia Plan: General LMA   Post-op Pain Management:    Induction:   PONV Risk Score and Plan:   Airway Management Planned:   Additional Equipment:   Intra-op Plan:   Post-operative Plan:   Informed Consent: I have reviewed the patients History and Physical, chart, labs and discussed the procedure including the risks, benefits and alternatives for the proposed anesthesia with the patient or authorized representative who has indicated his/her understanding and acceptance.       Plan Discussed with: CRNA  Anesthesia Plan Comments:         Anesthesia Quick Evaluation  

## 2017-08-31 NOTE — Op Note (Signed)
OPERATIVE NOTE  DATE OF SURGERY:  08/31/2017  PATIENT NAME:  Olivia Carpenter   DOB: [birth year]  MRN: [id number]   PRE-OPERATIVE DIAGNOSIS:  Internal derangement of the right knee   POST-OPERATIVE DIAGNOSIS:   Tear of the posterior horn the medial meniscus, right knee  PROCEDURE:  Right knee arthroscopy, partial medial meniscectomy  SURGEON:  Marciano Sequin., M.D.   ASSISTANT: none  ANESTHESIA: general  ESTIMATED BLOOD LOSS: Minimal  FLUIDS REPLACED: 1000 mL of crystalloid  TOURNIQUET TIME: Not used  DRAINS: none  IMPLANTS UTILIZED: None  INDICATIONS FOR SURGERY: JOURNEI THOMASSEN is a 57 y.o. year old female who has been seen for complaints of right knee pain. MRI demonstrated findings consistent with meniscal pathology. After discussion of the risks and benefits of surgical intervention, the patient expressed understanding of the risks benefits and agree with plans for right knee arthroscopy.   PROCEDURE IN DETAIL: The patient was brought into the operating room and, after adequate general anesthesia was achieved, a tourniquet was applied to the right thigh and the leg was placed in the leg holder. All bony prominences were well padded. The patient's right knee was cleaned and prepped with alcohol and Duraprep and draped in the usual sterile fashion. A "timeout" was performed as per usual protocol. The anticipated portal sites were injected with 0.25% Marcaine with epinephrine. An anterolateral incision was made and a cannula was inserted. A small effusion was evacuated and the knee was distended with fluid using the pump. The scope was advanced down the medial gutter into the medial compartment. Under visualization with the scope, an anteromedial portal was created and a hooked probe was inserted. The medial meniscus was visualized and probed.  There was a flap type tear of the posterior horn of the medial meniscus.  The tear was debrided using meniscal punches and a 4.5 mm incisor  shaver.  Final contour was performed using the 50 degree ArthroCare wand.  The posterior horn was then probed and felt to be stable.  There was some fraying noted to the anterior horn of the medial meniscus and this was debrided using the ArthroCare wand.  The articular cartilage was visualized and noted to be in reasonably good condition.   The scope was then advanced into the intercondylar notch. The anterior cruciate ligament was visualized and probed and felt to be intact. The scope was removed from the lateral portal and reinserted via the anteromedial portal to better visualize the lateral compartment. The lateral meniscus was visualized and probed.  The lateral meniscus was felt to be intact without any instability or tear.  The articular cartilage of the lateral compartment was visualized and noted to be in good condition.  Finally, the scope was advanced so as to visualize the patellofemoral articulation. Good patellar tracking was appreciated.  There was some mild fraying of the articular surface and this was debrided using the ArthroCare wand.  The knee was irrigated with copius amounts of fluid and suctioned dry. The anterolateral portal was re-approximated with #3-0 nylon. A combination of 0.25% Marcaine with epinephrine and 4 mg of Morphine were injected via the scope. The scope was removed and the anteromedial portal was re-approximated with #3-0 nylon. A sterile dressing was applied followed by application of an ice wrap.  The patient tolerated the procedure well and was transported to the PACU in stable condition.  [place] Gan P. Holley Bouche., M.D.

## 2017-09-01 ENCOUNTER — Encounter: Payer: Self-pay | Admitting: Orthopedic Surgery

## 2017-09-02 NOTE — Anesthesia Postprocedure Evaluation (Signed)
Anesthesia Post Note  Patient: Olivia Carpenter  Procedure(s) Performed: KNEE ARTHROSCOPY WITH MEDIAL MENISECTOMY (Right Knee)  Patient location during evaluation: PACU Anesthesia Type: General Level of consciousness: awake and alert Pain management: pain level controlled Vital Signs Assessment: post-procedure vital signs reviewed and stable Respiratory status: spontaneous breathing, nonlabored ventilation, respiratory function stable and patient connected to nasal cannula oxygen Cardiovascular status: blood pressure returned to baseline and stable Postop Assessment: no apparent nausea or vomiting Anesthetic complications: no     Last Vitals:  Vitals:   08/31/17 1634 08/31/17 1645  BP: 131/62 133/66  Pulse: 65 64  Resp: 12 20  Temp:  36.6 C  SpO2: 100% 100%    Last Pain:  Vitals:   09/01/17 0829  TempSrc:   PainSc: 0-No pain                 Molli Barrows

## 2017-10-31 ENCOUNTER — Ambulatory Visit (AMBULATORY_SURGERY_CENTER): Payer: Self-pay | Admitting: *Deleted

## 2017-10-31 ENCOUNTER — Other Ambulatory Visit: Payer: Self-pay

## 2017-10-31 VITALS — Ht 69.0 in | Wt 199.0 lb

## 2017-10-31 DIAGNOSIS — Z800 Family history of malignant neoplasm of digestive organs: Secondary | ICD-10-CM

## 2017-10-31 MED ORDER — PEG 3350-KCL-NA BICARB-NACL 420 G PO SOLR: 4000.0000 mL | Freq: Once | ORAL | 0 refills | Status: AC

## 2017-10-31 NOTE — Progress Notes (Signed)
No egg or soy allergy known to patient  No issues with past sedation with any surgeries  or procedures, no intubation problems  No diet pills per patient No home 02 use per patient  No blood thinners per patient  Pt denies issues with constipation as long as takes daily Senna- hx rectal fissure  No A fib or A flutter  EMMI video sent to pt's e mail - pt declined

## 2017-11-14 ENCOUNTER — Encounter: Payer: Self-pay | Admitting: Gastroenterology

## 2017-11-14 ENCOUNTER — Ambulatory Visit (AMBULATORY_SURGERY_CENTER): Payer: PRIVATE HEALTH INSURANCE | Admitting: Gastroenterology

## 2017-11-14 VITALS — BP 128/81 | HR 52 | Temp 96.9°F | Resp 18 | Ht 69.0 in | Wt 199.0 lb

## 2017-11-14 DIAGNOSIS — Z800 Family history of malignant neoplasm of digestive organs: Secondary | ICD-10-CM

## 2017-11-14 DIAGNOSIS — Z1211 Encounter for screening for malignant neoplasm of colon: Secondary | ICD-10-CM | POA: Diagnosis not present

## 2017-11-14 MED ORDER — SODIUM CHLORIDE 0.9 % IV SOLN: 500.0000 mL | Freq: Once | INTRAVENOUS | Status: DC

## 2017-11-14 NOTE — Patient Instructions (Signed)
YOU HAD AN ENDOSCOPIC PROCEDURE TODAY AT THE [facility]:   Refer to the procedure report that was given to you for any specific questions about what was found during the examination.  If the procedure report does not answer your questions, please call your gastroenterologist to clarify.  If you requested that your care partner not be given the details of your procedure findings, then the procedure report has been included in a sealed envelope for you to review at your convenience later.  YOU SHOULD EXPECT: Some feelings of bloating in the abdomen. Passage of more gas than usual.  Walking can help get rid of the air that was put into your GI tract during the procedure and reduce the bloating. If you had a lower endoscopy (such as a colonoscopy or flexible sigmoidoscopy) you may notice spotting of blood in your stool or on the toilet paper. If you underwent a bowel prep for your procedure, you may not have a normal bowel movement for a few days.  Please Note:  You might notice some irritation and congestion in your nose or some drainage.  This is from the oxygen used during your procedure.  There is no need for concern and it should clear up in a day or so.  SYMPTOMS TO REPORT IMMEDIATELY:   Following lower endoscopy (colonoscopy or flexible sigmoidoscopy):  Excessive amounts of blood in the stool  Significant tenderness or worsening of abdominal pains  Swelling of the abdomen that is new, acute  Fever of 100F or higher  For urgent or emergent issues, a gastroenterologist can be reached at any hour by calling [phone].   DIET:  We do recommend a small meal at first, but then you may proceed to your regular diet.  Drink plenty of fluids but you should avoid alcoholic beverages for 24 hours.  ACTIVITY:  You should plan to take it easy for the rest of today and you should NOT DRIVE or use heavy machinery until tomorrow (because of the sedation medicines used during the test).     FOLLOW UP: Our staff will call the number listed on your records the next business day following your procedure to check on you and address any questions or concerns that you may have regarding the information given to you following your procedure. If we do not reach you, we will leave a message.  However, if you are feeling well and you are not experiencing any problems, there is no need to return our call.  We will assume that you have returned to your regular daily activities without incident.  If any biopsies were taken you will be contacted by phone or by letter within the next 1-3 weeks.  Please call us at [phone] if you have not heard about the biopsies in 3 weeks.    SIGNATURES/CONFIDENTIALITY: You and/or your care partner have signed paperwork which will be entered into your electronic medical record.  These signatures attest to the fact that that the information above on your After Visit Summary has been reviewed and is understood.  Full responsibility of the confidentiality of this discharge information lies with you and/or your care-partner. 

## 2017-11-14 NOTE — Progress Notes (Signed)
Report given to PACU, vss 

## 2017-11-14 NOTE — Op Note (Signed)
[facility] Patient Name: Olivia Carpenter Procedure Date: 11/14/2017 10:33 AM MRN: [id number] Endoscopist: Milus Banister , MD Age: 57 Referring MD:  Date of Birth: 1960-06-04 Gender: Female Account #: [id number] Procedure:                Colonoscopy Indications:              Screening in patient at increased risk: Family                            history of 1st-degree relative with colorectal                            cancer before age 10 years (brother had colon                            cancer in his early 52s) Medicines:                Monitored Anesthesia Care Procedure:                Pre-Anesthesia Assessment:                           - Prior to the procedure, a History and Physical                            was performed, and patient medications and                            allergies were reviewed. The patient's tolerance of                            previous anesthesia was also reviewed. The risks                            and benefits of the procedure and the sedation                            options and risks were discussed with the patient.                            All questions were answered, and informed consent                            was obtained. Prior Anticoagulants: The patient has                            taken no previous anticoagulant or antiplatelet                            agents. ASA Grade Assessment: II - A patient with                            mild systemic disease. After reviewing the risks  and benefits, the patient was deemed in                            satisfactory condition to undergo the procedure.                           After obtaining informed consent, the colonoscope                            was passed under direct vision. Throughout the                            procedure, the patient's blood pressure, pulse, and                            oxygen saturations were monitored  continuously. The                            Model CF-HQ190L [id number]) scope was introduced                            through the anus and advanced to the the cecum,                            identified by appendiceal orifice and ileocecal                            valve. The colonoscopy was performed without                            difficulty. The patient tolerated the procedure                            well. The quality of the bowel preparation was                            good. The ileocecal valve, appendiceal orifice, and                            rectum were photographed. Scope In: 10:45:53 AM Scope Out: 10:59:17 AM Scope Withdrawal Time: 0 hours 9 minutes 54 seconds  Total Procedure Duration: 0 hours 13 minutes 24 seconds  Findings:                 The entire examined colon appeared normal on direct                            and retroflexion views. Complications:            No immediate complications. Estimated blood loss:                            None. Estimated Blood Loss:     Estimated blood loss: none. Impression:               - The entire examined  colon is normal on direct and                            retroflexion views.                           - No polyps or cancers. Recommendation:           - Patient has a contact number available for                            emergencies. The signs and symptoms of potential                            delayed complications were discussed with the                            patient. Return to normal activities tomorrow.                            Written discharge instructions were provided to the                            patient.                           - Resume previous diet.                           - Continue present medications.                           - Repeat colonoscopy in 5 years for screening. Milus Banister, MD 11/14/2017 11:01:16 AM This report has been signed electronically.

## 2017-11-14 NOTE — Progress Notes (Signed)
Pt's states no medical or surgical changes since previsit or office visit. 

## 2017-11-15 ENCOUNTER — Telehealth: Payer: Self-pay

## 2017-11-15 ENCOUNTER — Telehealth: Payer: Self-pay | Admitting: *Deleted

## 2017-11-15 NOTE — Telephone Encounter (Signed)
  Follow up Call-  Call back number 11/14/2017  Post procedure Call Back phone  # [phone]  Permission to leave phone message Yes  Some recent data might be hidden     Patient questions:  Do you have a fever, pain , or abdominal swelling? No. Pain Score  0 *  Have you tolerated food without any problems? Yes.    Have you been able to return to your normal activities? Yes.    Do you have any questions about your discharge instructions: Diet   No. Medications  No. Follow up visit  No.  Do you have questions or concerns about your Care? No.  Actions: * If pain score is 4 or above: No action needed, pain <4.

## 2017-11-15 NOTE — Telephone Encounter (Signed)
  Follow up Call-  Call back number 11/14/2017  Post procedure Call Back phone  # [phone]  Permission to leave phone message Yes  Some recent data might be hidden     Left message

## 2018-08-18 IMAGING — MR MR KNEE*R* W/O CM
6 series · 39 of 40 positions shown · non-contrast
Comparison: None.

CLINICAL DATA: Right knee pain for 4.5 months since a twisting
injury while walking a dog.

EXAM:
MRI OF THE RIGHT KNEE WITHOUT CONTRAST
TECHNIQUE: Multiplanar, multisequence MR imaging of the knee was performed. No
intravenous contrast was administered.

[Series 4: PD fat-sat · axial · 3.0mm · 0.33mm/px · z∈[-82,+37]mm · 8 of 37 slices shown (1 of 4)]
[im 1/37]
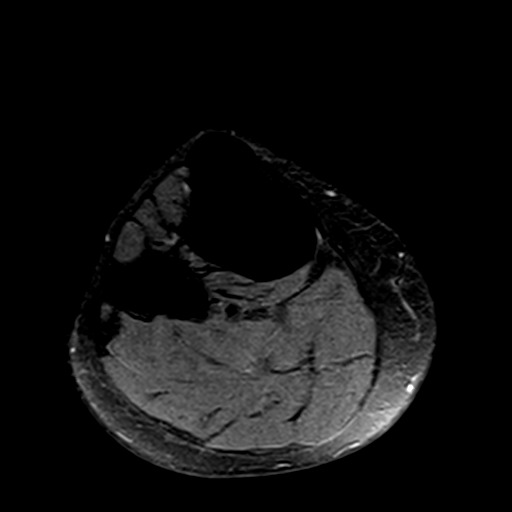
[im 6/37]
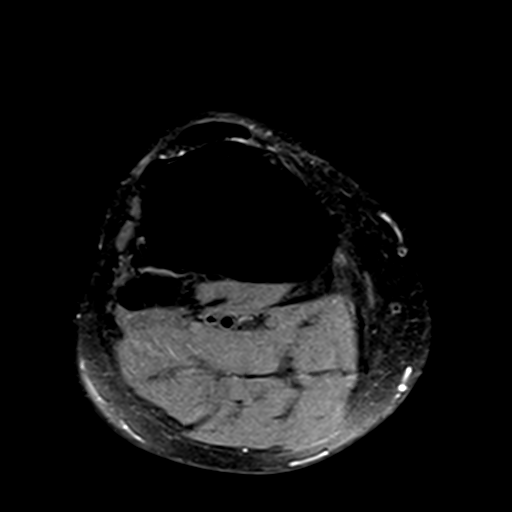
[im 11/37]
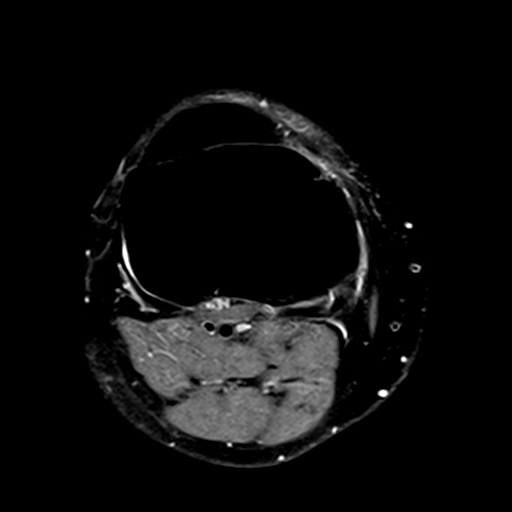
[im 16/37]
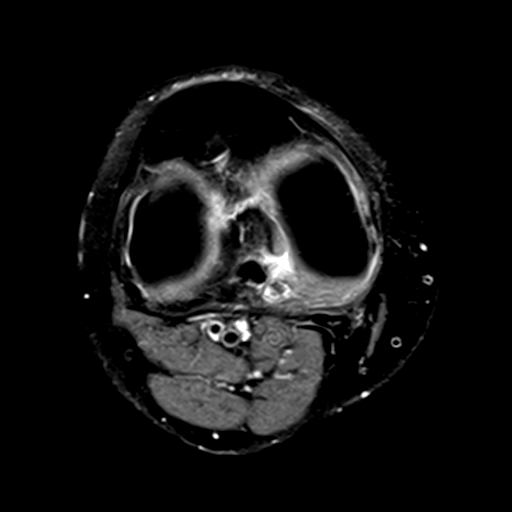
[im 21/37]
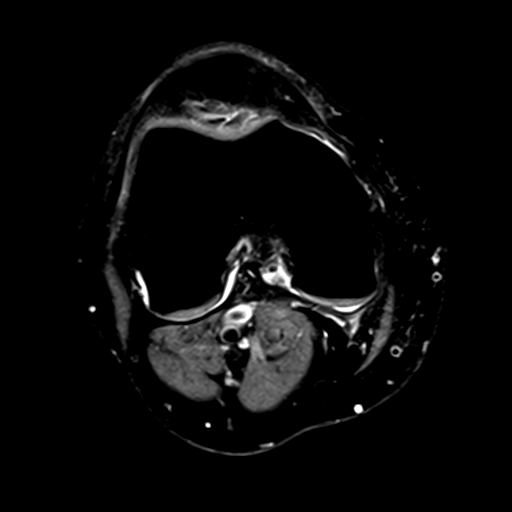
[im 26/37]
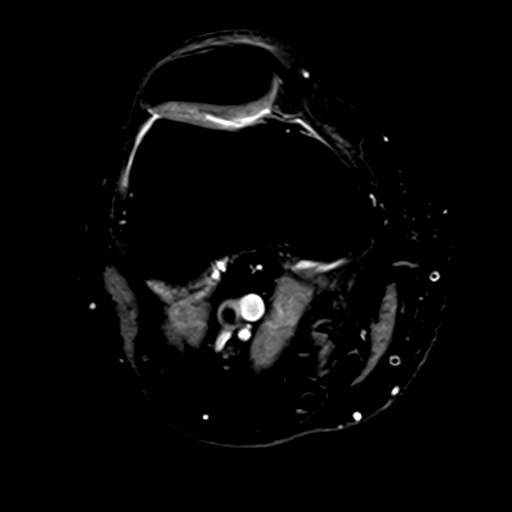
[im 31/37]
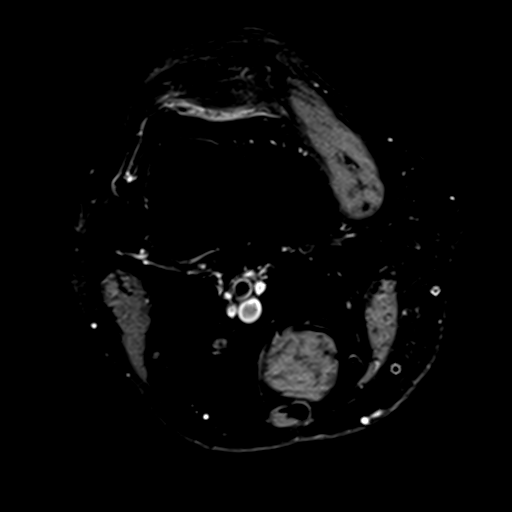
[im 37/37]
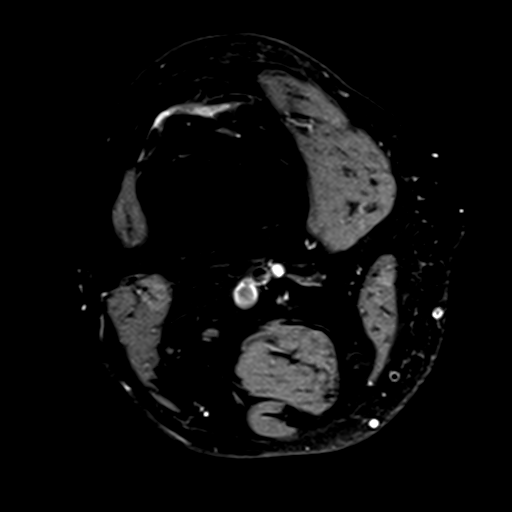

[Series 5: T1 · coronal · 3.0mm · 0.50mm/px · 6 of 36 slices shown]
[im 1/36]
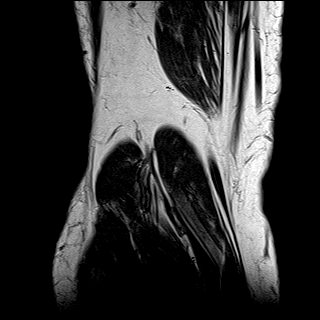
[im 6/36]
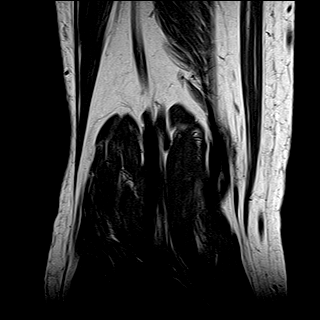
[im 12/36]
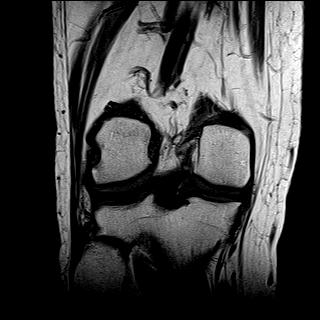
[im 18/36]
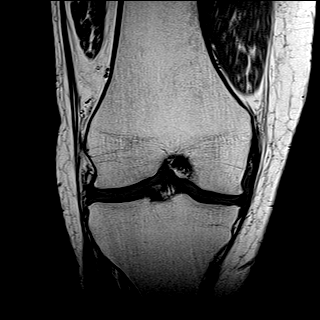
[im 24/36]
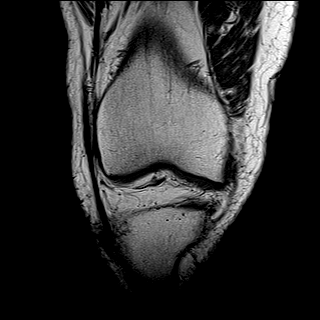
[im 30/36]
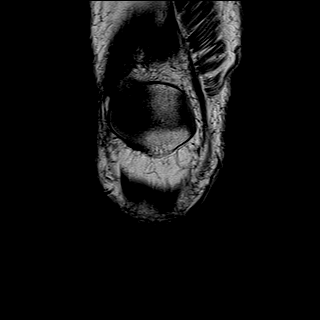

[Series 6: T2 fat-sat · coronal · 3.0mm · 0.50mm/px · 7 of 36 slices shown]
[im 1/36]
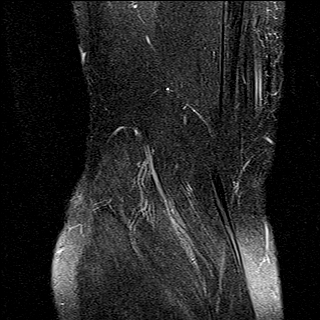
[im 6/36]
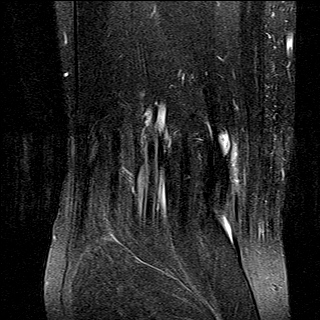
[im 12/36]
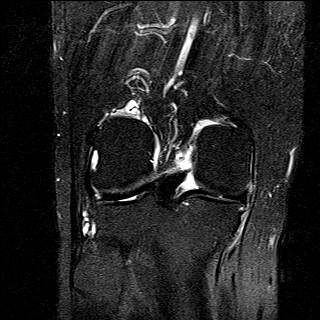
[im 18/36]
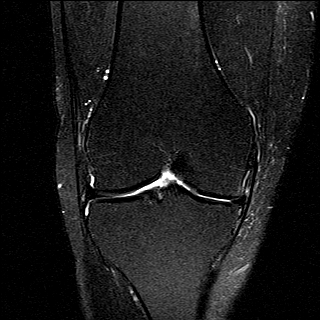
[im 24/36]
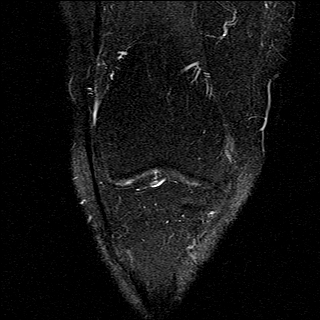
[im 30/36]
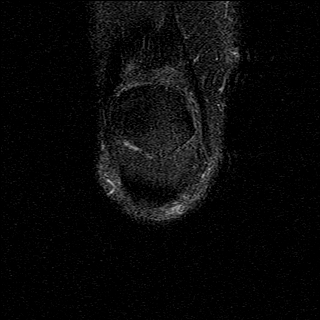
[im 36/36]
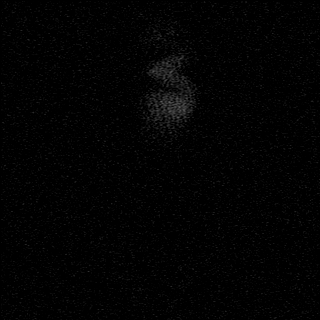

[Series 7: PD fat-sat · coronal · 3.0mm · 0.62mm/px · 7 of 36 slices shown (2 of 4)]
[im 1/36]
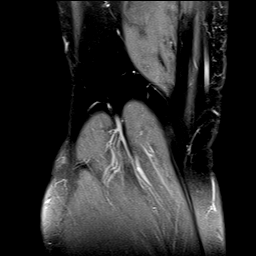
[im 6/36]
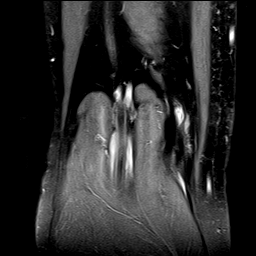
[im 12/36]
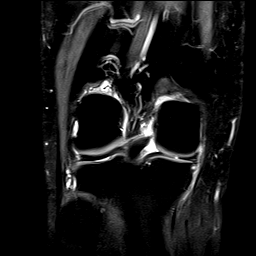
[im 18/36]
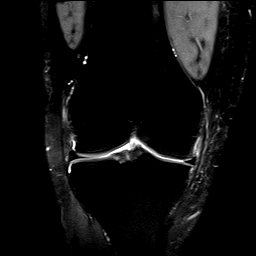
[im 24/36]
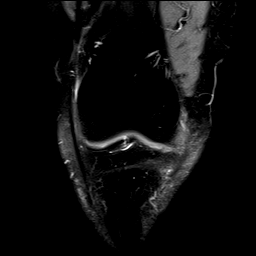
[im 30/36]
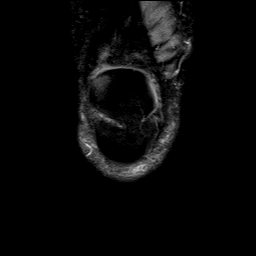
[im 36/36]
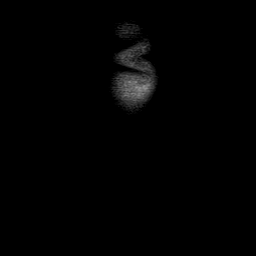

[Series 8: PD fat-sat · sagittal · 3.0mm · 0.62mm/px · 7 of 34 slices shown (3 of 4)]
[im 1/34]
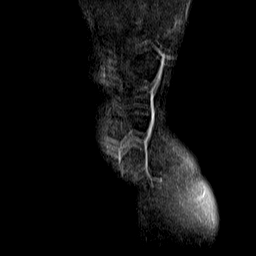
[im 6/34]
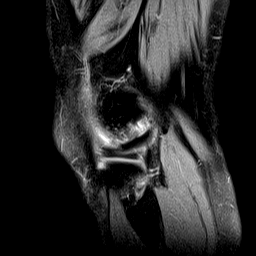
[im 12/34]
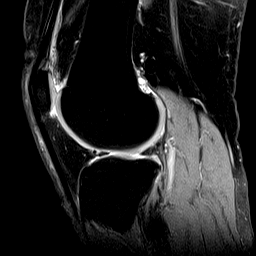
[im 17/34]
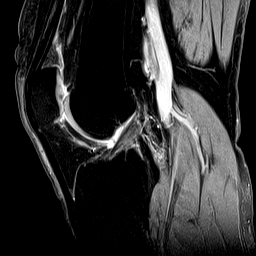
[im 23/34]
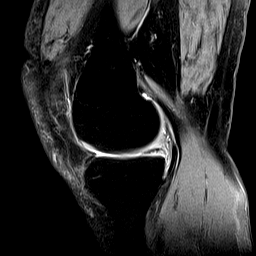
[im 28/34]
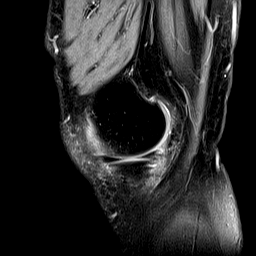
[im 34/34]
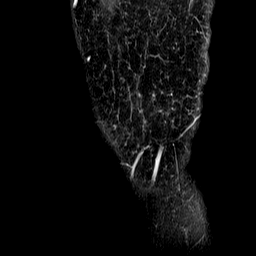

[Series 9: PD fat-sat · oblique · 2.0mm · 0.62mm/px · 4 of 19 slices shown (4 of 4)]
[im 1/19]
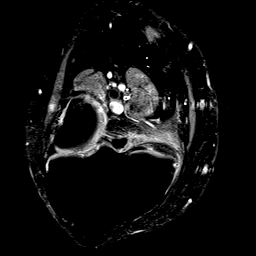
[im 7/19]
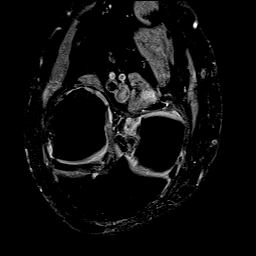
[im 13/19]
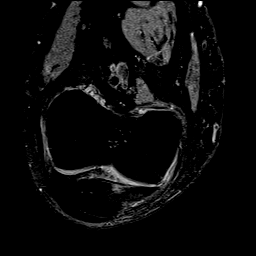
[im 19/19]
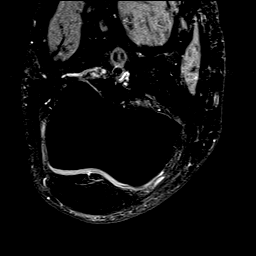

[39 of 40 positions shown; findings below may reference images not displayed]

FINDINGS: MENISCI

Medial meniscus: A tear in the posterior horn is mainly oblique in
orientation reaching the meniscal undersurface. There is some
fraying along the free edge of the posterior horn. The body is
somewhat degenerated and diminutive.

Lateral meniscus:  Intact.

LIGAMENTS

Cruciates:  Intact.

Collaterals:  Intact.

CARTILAGE

Patellofemoral: Mild irregularity of the cartilage surface of the
patella at the apex in the mid and upper poles identified.

Medial: Thinned with some associated joint space narrowing. No focal
defect.

Lateral:  Minimally degenerated.

Joint:  Trace amount of joint fluid.

Popliteal Fossa:  No Baker's cyst.

Extensor Mechanism:  Intact.

Bones: No fracture or worrisome lesion. There is some osteophytosis
about the knee most notable off the posterior aspect of the medial
tibial plateau.

Other: None.
IMPRESSION: Tear of the posterior horn of the medial meniscus is mainly oblique
in orientation reaching the meniscal undersurface. Fraying is seen
along the free edge of the posterior horn and body.

Mild osteoarthritis most notable in the medial compartment.

## 2019-08-09 ENCOUNTER — Ambulatory Visit: Payer: PRIVATE HEALTH INSURANCE | Attending: Internal Medicine

## 2019-08-09 DIAGNOSIS — Z23 Encounter for immunization: Secondary | ICD-10-CM

## 2019-08-09 NOTE — Progress Notes (Signed)
   Covid-19 Vaccination Clinic  Name:  Olivia Carpenter    MRN: [id number] DOB: November 02, 1960  08/09/2019  Olivia Carpenter was observed post Covid-19 immunization for 15 minutes without incident. She was provided with Vaccine Information Sheet and instruction to access the V-Safe system.   Olivia Carpenter was instructed to call 911 with any severe reactions post vaccine: Marland Kitchen Difficulty breathing  . Swelling of face and throat  . A fast heartbeat  . A bad rash all over body  . Dizziness and weakness   Immunizations Administered    Name Date Dose VIS Date Route   Pfizer COVID-19 Vaccine 08/09/2019  9:31 AM 0.3 mL 04/13/2019 Intramuscular   Manufacturer: [place]   Lot: [id number]   [place]: [id number]

## 2019-09-04 ENCOUNTER — Ambulatory Visit: Payer: PRIVATE HEALTH INSURANCE | Attending: Internal Medicine

## 2019-09-04 DIAGNOSIS — Z23 Encounter for immunization: Secondary | ICD-10-CM

## 2019-09-04 NOTE — Progress Notes (Signed)
   Covid-19 Vaccination Clinic  Name:  ADAMARIS CASH    MRN: [id number] DOB: 02/02/1961  09/04/2019  Ms. Lipsett was observed post Covid-19 immunization for 15 minutes without incident. She was provided with Vaccine Information Sheet and instruction to access the V-Safe system.   Ms. Hosking was instructed to call 911 with any severe reactions post vaccine: Marland Kitchen Difficulty breathing  . Swelling of face and throat  . A fast heartbeat  . A bad rash all over body  . Dizziness and weakness   Immunizations Administered    Name Date Dose VIS Date Route   Pfizer COVID-19 Vaccine 09/04/2019  9:29 AM 0.3 mL 06/27/2018 Intramuscular   Manufacturer: [place]   Lot: [id number]   [place]: [id number]

## 2019-10-25 ENCOUNTER — Ambulatory Visit (INDEPENDENT_AMBULATORY_CARE_PROVIDER_SITE_OTHER): Payer: PRIVATE HEALTH INSURANCE | Admitting: Dermatology

## 2019-10-25 ENCOUNTER — Encounter: Payer: Self-pay | Admitting: Dermatology

## 2019-10-25 ENCOUNTER — Other Ambulatory Visit: Payer: Self-pay

## 2019-10-25 DIAGNOSIS — L2389 Allergic contact dermatitis due to other agents: Secondary | ICD-10-CM

## 2019-10-25 DIAGNOSIS — L720 Epidermal cyst: Secondary | ICD-10-CM

## 2019-10-25 DIAGNOSIS — L738 Other specified follicular disorders: Secondary | ICD-10-CM | POA: Diagnosis not present

## 2019-10-25 DIAGNOSIS — L237 Allergic contact dermatitis due to plants, except food: Secondary | ICD-10-CM

## 2019-10-25 MED ORDER — HALOBETASOL PROPIONATE 0.05 % EX CREA: TOPICAL_CREAM | Freq: Two times a day (BID) | CUTANEOUS | 0 refills | Status: DC

## 2019-10-25 NOTE — Patient Instructions (Addendum)
Recommend daily broad spectrum sunscreen SPF 30+ to sun-exposed areas, reapply every 2 hours as needed. Call for new or changing lesions.  Avoid applying to face, groin, and axilla. Use as directed. Risk of skin atrophy with long-term use reviewed.   Topical steroids (such as triamcinolone, fluocinolone, fluocinonide, mometasone, clobetasol, halobetasol, betamethasone, hydrocortisone) can cause thinning and lightening of the skin if they are used for too long in the same area. Your physician has selected the right strength medicine for your problem and area affected on the body. Please use your medication only as directed by your physician to prevent side effects.

## 2019-10-25 NOTE — Progress Notes (Signed)
   Follow-Up Visit   Subjective  Olivia Carpenter is a 59 y.o. female who presents for the following: area of concern.  Patient presents today for an area of concern on her forehead and a rash on her arms  The following portions of the chart were reviewed this encounter and updated as appropriate:  Tobacco  Allergies  Meds  Problems  Med Hx  Surg Hx  Fam Hx      Review of Systems:  No other skin or systemic complaints except as noted in HPI or Assessment and Plan.  Objective  Well appearing patient in no apparent distress; mood and affect are within normal limits.  A focused examination was performed including Arms and forehead. Relevant physical exam findings are noted in the Assessment and Plan.  Objective  Right Arm and wrist: Linear erythematous patches  Objective  Glabella: Smooth white papule(s).   Objective  Forehead (2): Small yellow papules with a central dell.    Assessment & Plan    Allergic contact dermatitis due to poison ivy Right Arm and wrist  To poison ivy  Start Ultravate lotion apply to affected area twice daily as needed for itch  Topical steroids (such as triamcinolone, fluocinolone, fluocinonide, mometasone, clobetasol, halobetasol, betamethasone, hydrocortisone) can cause thinning and lightening of the skin if they are used for too long in the same area. Your physician has selected the right strength medicine for your problem and area affected on the body. Please use your medication only as directed by your physician to prevent side effects.   Avoid applying to face, groin, and axilla. Use as directed. Risk of skin atrophy with long-term use reviewed.    Milia Glabella  Patient will consider removal at future appointment  Sebaceous hyperplasia (2) Forehead  Benign, observe.    Return for as scheduled.  Marene Lenz, CMA, am acting as scribe for Sarina Ser, MD . Documentation: I have reviewed the above documentation for  accuracy and completeness, and I agree with the above.  Sarina Ser, MD

## 2019-10-31 ENCOUNTER — Encounter: Payer: Self-pay | Admitting: Dermatology

## 2019-11-06 ENCOUNTER — Other Ambulatory Visit: Payer: Self-pay

## 2019-11-06 MED ORDER — HALOBETASOL PROPIONATE 0.05 % EX CREA: TOPICAL_CREAM | Freq: Two times a day (BID) | CUTANEOUS | 0 refills | Status: DC

## 2019-11-06 NOTE — Progress Notes (Signed)
Prescription sent in again. [facility] states they never received RX in June.

## 2020-04-21 ENCOUNTER — Other Ambulatory Visit: Payer: Self-pay

## 2020-04-21 ENCOUNTER — Ambulatory Visit (INDEPENDENT_AMBULATORY_CARE_PROVIDER_SITE_OTHER): Payer: PRIVATE HEALTH INSURANCE | Admitting: Dermatology

## 2020-04-21 DIAGNOSIS — Z86018 Personal history of other benign neoplasm: Secondary | ICD-10-CM

## 2020-04-21 DIAGNOSIS — L820 Inflamed seborrheic keratosis: Secondary | ICD-10-CM | POA: Diagnosis not present

## 2020-04-21 DIAGNOSIS — L821 Other seborrheic keratosis: Secondary | ICD-10-CM

## 2020-04-21 DIAGNOSIS — D171 Benign lipomatous neoplasm of skin and subcutaneous tissue of trunk: Secondary | ICD-10-CM | POA: Diagnosis not present

## 2020-04-21 DIAGNOSIS — L578 Other skin changes due to chronic exposure to nonionizing radiation: Secondary | ICD-10-CM

## 2020-04-21 DIAGNOSIS — D229 Melanocytic nevi, unspecified: Secondary | ICD-10-CM

## 2020-04-21 DIAGNOSIS — L719 Rosacea, unspecified: Secondary | ICD-10-CM | POA: Diagnosis not present

## 2020-04-21 DIAGNOSIS — L720 Epidermal cyst: Secondary | ICD-10-CM | POA: Diagnosis not present

## 2020-04-21 DIAGNOSIS — Z1283 Encounter for screening for malignant neoplasm of skin: Secondary | ICD-10-CM

## 2020-04-21 DIAGNOSIS — D1800 Hemangioma unspecified site: Secondary | ICD-10-CM

## 2020-04-21 DIAGNOSIS — Z85820 Personal history of malignant melanoma of skin: Secondary | ICD-10-CM

## 2020-04-21 NOTE — Patient Instructions (Signed)
Melanoma ABCDEs  Melanoma is the most dangerous type of skin cancer, and is the leading cause of death from skin disease.  You are more likely to develop melanoma if you:  Have light-colored skin, light-colored eyes, or red or blond hair  Spend a lot of time in the sun  Tan regularly, either outdoors or in a tanning bed  Have had blistering sunburns, especially during childhood  Have a close family member who has had a melanoma  Have atypical moles or large birthmarks  Early detection of melanoma is key since treatment is typically straightforward and cure rates are extremely high if we catch it early.   The first sign of melanoma is often a change in a mole or a new dark spot.  The ABCDE system is a way of remembering the signs of melanoma.  A for asymmetry:  The two halves do not match. B for border:  The edges of the growth are irregular. C for color:  A mixture of colors are present instead of an even brown color. D for diameter:  Melanomas are usually (but not always) greater than 69mm - the size of a pencil eraser. E for evolution:  The spot keeps changing in size, shape, and color.  Please check your skin once per month between visits. You can use a small mirror in front and a large mirror behind you to keep an eye on the back side or your body.   If you see any new or changing lesions before your next follow-up, please call to schedule a visit.  Please continue daily skin protection including broad spectrum sunscreen SPF 30+ to sun-exposed areas, reapplying every 2 hours as needed when you're outdoors.  Seborrheic Keratosis  What causes seborrheic keratoses? Seborrheic keratoses are harmless, common skin growths that first appear during adult life.  As time goes by, more growths appear.  Some people may develop a large number of them.  Seborrheic keratoses appear on both covered and uncovered body parts.  They are not caused by sunlight.  The tendency to develop seborrheic  keratoses can be inherited.  They vary in color from skin-colored to gray, brown, or even black.  They can be either smooth or have a rough, warty surface.   Seborrheic keratoses are superficial and look as if they were stuck on the skin.  Under the microscope this type of keratosis looks like layers upon layers of skin.  That is why at times the top layer may seem to fall off, but the rest of the growth remains and re-grows.    Treatment Seborrheic keratoses do not need to be treated, but can easily be removed in the office.  Seborrheic keratoses often cause symptoms when they rub on clothing or jewelry.  Lesions can be in the way of shaving.  If they become inflamed, they can cause itching, soreness, or burning.  Removal of a seborrheic keratosis can be accomplished by freezing, burning, or surgery. If any spot bleeds, scabs, or grows rapidly, please return to have it checked, as these can be an indication of a skin cancer. Cryotherapy Aftercare   Wash gently with soap and water everyday.    Apply Vaseline and Band-Aid daily until healed.

## 2020-04-21 NOTE — Progress Notes (Signed)
Follow-Up Visit   Subjective  Olivia Carpenter is a 59 y.o. female who presents for the following: tbse (Patient here today for TBSE, states she has a couple of lipomas 1 on left side abdomen noticed in last 3 - 4 months and 1 on left side thigh noticed several years ago. Patient also states she has a rough area on right side of face near temple. Patient states she noticed it a year ago. ). The patient presents for Total-Body Skin Exam (TBSE) for skin cancer screening and mole check.  The following portions of the chart were reviewed this encounter and updated as appropriate:  Tobacco  Allergies  Meds  Problems  Med Hx  Surg Hx  Fam Hx      Objective  Well appearing patient in no apparent distress; mood and affect are within normal limits.  A full examination was performed including scalp, head, eyes, ears, nose, lips, neck, chest, axillae, abdomen, back, buttocks, bilateral upper extremities, bilateral lower extremities, hands, feet, fingers, toes, fingernails, and toenails. All findings within normal limits unless otherwise noted below.  Objective  right side face x 2 (2): Erythematous keratotic or waxy stuck-on papule or plaque.   Objective  left UQA , left proximal thigh: 1.5 cm fatty nodule   Objective  nasal bridge: Smooth white papule(s).   Objective  face: Telangiectasias   Assessment & Plan  Inflamed seborrheic keratosis (2) right side face x 2 BBL for dyschromia and actinic changes discussed   Destruction of lesion - right side face x 2 Complexity: simple   Destruction method: cryotherapy   Informed consent: discussed and consent obtained   Timeout:  patient name, date of birth, surgical site, and procedure verified Lesion destroyed using liquid nitrogen: Yes   Region frozen until ice ball extended beyond lesion: Yes   Outcome: patient tolerated procedure well with no complications   Post-procedure details: wound care instructions given    Lipoma of  torso left UQA , left proximal thigh Benign-appearing.  Observation.  Call clinic for new or changing moles.  Recommend daily use of broad spectrum spf 30+ sunscreen to sun-exposed areas.   Milia nasal bridge Benign-appearing.  Observation.  Call clinic for new or changing moles.  Recommend daily use of broad spectrum spf 30+ sunscreen to sun-exposed areas.  Consider topical Retinoid Rx in future if needed.  Pt declines today. Rosacea face Telangiectatic  Discussed BBL laser; pt declines treatment today.   Lentigines - Scattered tan macules - Discussed due to sun exposure - Benign, observe - Call for any changes  Seborrheic Keratoses - Stuck-on, waxy, tan-brown papules and plaques  - Discussed benign etiology and prognosis. - Observe - Call for any changes  Melanocytic Nevi - Tan-brown and/or pink-flesh-colored symmetric macules and papules - Benign appearing on exam today - Observation - Call clinic for new or changing moles - Recommend daily use of broad spectrum spf 30+ sunscreen to sun-exposed areas.   Hemangiomas - Red papules - Discussed benign nature - Observe - Call for any changes  Actinic Damage - Chronic, secondary to cumulative UV/sun exposure - diffuse scaly erythematous macules with underlying dyspigmentation - Recommend daily broad spectrum sunscreen SPF 30+ to sun-exposed areas, reapply every 2 hours as needed.  - Call for new or changing lesions.  Skin cancer screening performed today. History of Dysplastic Nevi - No evidence of recurrence today - Recommend regular full body skin exams - Recommend daily broad spectrum sunscreen SPF 30+ to sun-exposed areas,  reapply every 2 hours as needed.   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  - Call if any new or changing lesions are noted between office visits  Return in about 1 year (around 04/21/2021) for TBSE.  IRuthell Rummage, CMA, am acting as scribe for Sarina Ser, MD.  Documentation: I have reviewed the above documentation for accuracy and completeness, and I agree with the above.  Sarina Ser, MD

## 2020-04-30 ENCOUNTER — Encounter: Payer: Self-pay | Admitting: Dermatology

## 2021-04-15 ENCOUNTER — Encounter: Payer: Self-pay | Admitting: Dermatology

## 2021-04-15 ENCOUNTER — Other Ambulatory Visit: Payer: Self-pay

## 2021-04-15 ENCOUNTER — Ambulatory Visit (INDEPENDENT_AMBULATORY_CARE_PROVIDER_SITE_OTHER): Payer: PRIVATE HEALTH INSURANCE | Admitting: Dermatology

## 2021-04-15 DIAGNOSIS — L820 Inflamed seborrheic keratosis: Secondary | ICD-10-CM | POA: Diagnosis not present

## 2021-04-15 DIAGNOSIS — L573 Poikiloderma of Civatte: Secondary | ICD-10-CM | POA: Diagnosis not present

## 2021-04-15 DIAGNOSIS — Z1283 Encounter for screening for malignant neoplasm of skin: Secondary | ICD-10-CM

## 2021-04-15 DIAGNOSIS — L719 Rosacea, unspecified: Secondary | ICD-10-CM

## 2021-04-15 DIAGNOSIS — Z86018 Personal history of other benign neoplasm: Secondary | ICD-10-CM

## 2021-04-15 DIAGNOSIS — D1800 Hemangioma unspecified site: Secondary | ICD-10-CM

## 2021-04-15 DIAGNOSIS — L720 Epidermal cyst: Secondary | ICD-10-CM

## 2021-04-15 DIAGNOSIS — Z85820 Personal history of malignant melanoma of skin: Secondary | ICD-10-CM | POA: Diagnosis not present

## 2021-04-15 DIAGNOSIS — L578 Other skin changes due to chronic exposure to nonionizing radiation: Secondary | ICD-10-CM

## 2021-04-15 DIAGNOSIS — L821 Other seborrheic keratosis: Secondary | ICD-10-CM

## 2021-04-15 DIAGNOSIS — L814 Other melanin hyperpigmentation: Secondary | ICD-10-CM

## 2021-04-15 DIAGNOSIS — D229 Melanocytic nevi, unspecified: Secondary | ICD-10-CM

## 2021-04-15 NOTE — Patient Instructions (Addendum)
If You Need Anything After Your Visit  If you have any questions or concerns for your doctor, please call our main line at [phone] and press option 4 to reach your doctor's medical assistant. If no one answers, please leave a voicemail as directed and we will return your call as soon as possible. Messages left after 4 pm will be answered the following business day.   You may also send us a message via MyChart. We typically respond to MyChart messages within 1-2 business days.  For prescription refills, please ask your pharmacy to contact our office. Our fax number is [phone].  If you have an urgent issue when the clinic is closed that cannot wait until the next business day, you can page your doctor at the number below.    Please note that while we do our best to be available for urgent issues outside of office hours, we are not available 24/7.   If you have an urgent issue and are unable to reach us, you may choose to seek medical care at your doctor's office, retail clinic, urgent care center, or emergency room.  If you have a medical emergency, please immediately call 911 or go to the emergency department.  Pager Numbers  - Dr. Kowalski: [phone]  - Dr. Moye: [phone]  - Dr. Stewart: [phone]  In the event of inclement weather, please call our main line at [phone] for an update on the status of any delays or closures.  Dermatology Medication Tips: Please keep the boxes that topical medications come in in order to help keep track of the instructions about where and how to use these. Pharmacies typically print the medication instructions only on the boxes and not directly on the medication tubes.   If your medication is too expensive, please contact our office at [phone] option 4 or send us a message through MyChart.   We are unable to tell what your co-pay for medications will be in advance as this is different depending on your insurance coverage.  However, we may be able to find a substitute medication at lower cost or fill out paperwork to get insurance to cover a needed medication.   If a prior authorization is required to get your medication covered by your insurance company, please allow us 1-2 business days to complete this process.  Drug prices often vary depending on where the prescription is filled and some pharmacies may offer cheaper prices.  The website www.goodrx.com contains coupons for medications through different pharmacies. The prices here do not account for what the cost may be with help from insurance (it may be cheaper with your insurance), but the website can give you the price if you did not use any insurance.  - You can print the associated coupon and take it with your prescription to the pharmacy.  - You may also stop by our office during regular business hours and pick up a GoodRx coupon card.  - If you need your prescription sent electronically to a different pharmacy, notify our office through [facility] MyChart or by phone at [phone] option 4.     Si Usted Necesita Algo Despus de Su Visita  Tambin puede enviarnos un mensaje a travs de MyChart. Por lo general respondemos a los mensajes de MyChart en el transcurso de 1 a 2 das hbiles.  Para renovar recetas, por favor pida a su farmacia que se ponga en contacto con nuestra oficina. Nuestro nmero de fax es el [phone].  Si tiene   un asunto urgente cuando la clnica est cerrada y que no puede esperar hasta el siguiente da hbil, puede llamar/localizar a su doctor(a) al nmero que aparece a continuacin.   Por favor, tenga en cuenta que aunque hacemos todo lo posible para estar disponibles para asuntos urgentes fuera del horario de oficina, no estamos disponibles las 24 horas del da, los 7 das de la semana.   Si tiene un problema urgente y no puede comunicarse con nosotros, puede optar por buscar atencin mdica  en el consultorio de su  doctor(a), en una clnica privada, en un centro de atencin urgente o en una sala de emergencias.  Si tiene una emergencia mdica, por favor llame inmediatamente al 911 o vaya a la sala de emergencias.  Nmeros de bper  - Dr. Kowalski: [phone]  - Dra. Moye: [phone]  - Dra. Stewart: [phone]  En caso de inclemencias del tiempo, por favor llame a nuestra lnea principal al [phone] para una actualizacin sobre el estado de cualquier retraso o cierre.  Consejos para la medicacin en dermatologa: Por favor, guarde las cajas en las que vienen los medicamentos de uso tpico para ayudarle a seguir las instrucciones sobre dnde y cmo usarlos. Las farmacias generalmente imprimen las instrucciones del medicamento slo en las cajas y no directamente en los tubos del medicamento.   Si su medicamento es muy caro, por favor, pngase en contacto con nuestra oficina llamando al [phone] y presione la opcin 4 o envenos un mensaje a travs de MyChart.   No podemos decirle cul ser su copago por los medicamentos por adelantado ya que esto es diferente dependiendo de la cobertura de su seguro. Sin embargo, es posible que podamos encontrar un medicamento sustituto a menor costo o llenar un formulario para que el seguro cubra el medicamento que se considera necesario.   Si se requiere una autorizacin previa para que su compaa de seguros cubra su medicamento, por favor permtanos de 1 a 2 das hbiles para completar este proceso.  Los precios de los medicamentos varan con frecuencia dependiendo del lugar de dnde se surte la receta y alguna farmacias pueden ofrecer precios ms baratos.  El sitio web www.goodrx.com tiene cupones para medicamentos de diferentes farmacias. Los precios aqu no tienen en cuenta lo que podra costar con la ayuda del seguro (puede ser ms barato con su seguro), pero el sitio web puede darle el precio si no utiliz ningn seguro.  - Puede imprimir el cupn  correspondiente y llevarlo con su receta a la farmacia.  - Tambin puede pasar por nuestra oficina durante el horario de atencin regular y recoger una tarjeta de cupones de GoodRx.  - Si necesita que su receta se enve electrnicamente a una farmacia diferente, informe a nuestra oficina a travs de MyChart de [facility] o por telfono llamando al [phone] y presione la opcin 4.   Cryotherapy Aftercare  Wash gently with soap and water everyday.   Apply Vaseline and Band-Aid daily until healed.  

## 2021-04-15 NOTE — Progress Notes (Signed)
Follow-Up Visit   Subjective  Olivia Carpenter is a 60 y.o. female who presents for the following: Total body skin exam (Hx of Melanoma R calf, L forehead, hx of Dysplastic nevi) and check spots (R popliteal fossa, gets irritated from shaving/R ant waistline, chest, itchy). The patient presents for Total-Body Skin Exam (TBSE) for skin cancer screening and mole check.  The patient has spots, moles and lesions to be evaluated, some may be new or changing and the patient has concerns that these could be cancer.  The following portions of the chart were reviewed this encounter and updated as appropriate:   Tobacco   Allergies   Meds   Problems   Med Hx   Surg Hx   Fam Hx      Review of Systems:  No other skin or systemic complaints except as noted in HPI or Assessment and Plan.  Objective  Well appearing patient in no apparent distress; mood and affect are within normal limits.  A full examination was performed including scalp, head, eyes, ears, nose, lips, neck, chest, axillae, abdomen, back, buttocks, bilateral upper extremities, bilateral lower extremities, hands, feet, fingers, toes, fingernails, and toenails. All findings within normal limits unless otherwise noted below.  L forehead, R calf Well healed scars with no evidence of recurrence, no lymphadenopathy.   face Erythema and telangiectasias face  neck Actinic changes  R ant waistline x 1, R popliteal x 1, chest x 3, Total = 5 (5) Erythematous keratotic or waxy stuck-on papule or plaque.    Assessment & Plan   Lentigines - Scattered tan macules - Due to sun exposure - Benign-appearing, observe - Recommend daily broad spectrum sunscreen SPF 30+ to sun-exposed areas, reapply every 2 hours as needed. - Call for any changes  Seborrheic Keratoses - Stuck-on, waxy, tan-brown papules and/or plaques  - Benign-appearing - Discussed benign etiology and prognosis. - Observe - Call for any changes  Melanocytic Nevi -  Tan-brown and/or pink-flesh-colored symmetric macules and papules - Benign appearing on exam today - Observation - Call clinic for new or changing moles - Recommend daily use of broad spectrum spf 30+ sunscreen to sun-exposed areas.   Hemangiomas - Red papules - Discussed benign nature - Observe - Call for any changes  Actinic Damage - Chronic condition, secondary to cumulative UV/sun exposure - diffuse scaly erythematous macules with underlying dyspigmentation - Recommend daily broad spectrum sunscreen SPF 30+ to sun-exposed areas, reapply every 2 hours as needed.  - Staying in the shade or wearing long sleeves, sun glasses (UVA+UVB protection) and wide brim hats (4-inch brim around the entire circumference of the hat) are also recommended for sun protection.  - Call for new or changing lesions.  Skin cancer screening performed today.  Milia - tiny firm white papules - type of cyst - benign - may be extracted if symptomatic - observe   History of melanoma L forehead, R calf Clear.  No lymphadenopathy.  Observe for recurrence. Call clinic for new or changing lesions.  Recommend regular skin exams, daily broad-spectrum spf 30+ sunscreen use, and photoprotection.    Rosacea face Rosacea is a chronic progressive skin condition usually affecting the face of adults, causing redness and/or acne bumps. It is treatable but not curable. It sometimes affects the eyes (ocular rosacea) as well. It may respond to topical and/or systemic medication and can flare with stress, sun exposure, alcohol, exercise and some foods.  Daily application of broad spectrum spf 30+ sunscreen to face  is recommended to reduce flares.  Discussed the treatment option of BBL/laser.  Typically we recommend 1-3 treatment sessions about 5-8 weeks apart for best results.  The patient's condition may require "maintenance treatments" in the future.  The fee for BBL / laser treatments is $350 per treatment session for the  whole face.  A fee can be quoted for other parts of the body. Insurance typically does not pay for BBL/laser treatments and therefore the fee is an out-of-pocket cost.  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  Treatment options include skin lighteners and laser Benign, observe.    Inflamed seborrheic keratosis R ant waistline x 1, R popliteal x 1, chest x 3, Total = 5 Destruction of lesion - R ant waistline x 1, R popliteal x 1, chest x 3, Total = 5 Complexity: simple   Destruction method: cryotherapy   Informed consent: discussed and consent obtained   Timeout:  patient name, date of birth, surgical site, and procedure verified Lesion destroyed using liquid nitrogen: Yes   Region frozen until ice ball extended beyond lesion: Yes   Outcome: patient tolerated procedure well with no complications   Post-procedure details: wound care instructions given    Skin cancer screening  History of Dysplastic Nevi - No evidence of recurrence today - Recommend regular full body skin exams - Recommend daily broad spectrum sunscreen SPF 30+ to sun-exposed areas, reapply every 2 hours as needed.  - Call if any new or changing lesions are noted between office visits   Return in about 1 year (around 04/15/2022) for TBSE, Hx of Melanoma, Hx of Dysplastic nevi.  I, Olivia Carpenter, RMA, am acting as scribe for Sarina Ser, MD . Documentation: I have reviewed the above documentation for accuracy and completeness, and I agree with the above.  Sarina Ser, MD

## 2021-04-20 ENCOUNTER — Encounter: Payer: Self-pay | Admitting: Dermatology

## 2021-10-07 ENCOUNTER — Ambulatory Visit: Payer: PRIVATE HEALTH INSURANCE | Admitting: Dermatology

## 2021-10-28 ENCOUNTER — Other Ambulatory Visit: Payer: Self-pay | Admitting: Orthopedic Surgery

## 2021-10-28 DIAGNOSIS — M1712 Unilateral primary osteoarthritis, left knee: Secondary | ICD-10-CM

## 2021-10-28 DIAGNOSIS — G8929 Other chronic pain: Secondary | ICD-10-CM

## 2021-11-05 ENCOUNTER — Ambulatory Visit
Admission: RE | Admit: 2021-11-05 | Discharge: 2021-11-05 | Disposition: A | Discharge status: Home / Self Care | Payer: PRIVATE HEALTH INSURANCE | Source: Ambulatory Visit | Attending: Orthopedic Surgery | Admitting: Orthopedic Surgery

## 2021-11-05 DIAGNOSIS — M25562 Pain in left knee: Secondary | ICD-10-CM | POA: Diagnosis present

## 2021-11-05 DIAGNOSIS — G8929 Other chronic pain: Secondary | ICD-10-CM | POA: Insufficient documentation

## 2021-11-05 DIAGNOSIS — M1712 Unilateral primary osteoarthritis, left knee: Secondary | ICD-10-CM | POA: Diagnosis present

## 2022-04-19 ENCOUNTER — Ambulatory Visit (INDEPENDENT_AMBULATORY_CARE_PROVIDER_SITE_OTHER): Payer: PRIVATE HEALTH INSURANCE | Admitting: Dermatology

## 2022-04-19 VITALS — BP 130/77 | HR 72

## 2022-04-19 DIAGNOSIS — L719 Rosacea, unspecified: Secondary | ICD-10-CM | POA: Diagnosis not present

## 2022-04-19 DIAGNOSIS — Z79899 Other long term (current) drug therapy: Secondary | ICD-10-CM

## 2022-04-19 DIAGNOSIS — Z1283 Encounter for screening for malignant neoplasm of skin: Secondary | ICD-10-CM

## 2022-04-19 DIAGNOSIS — D229 Melanocytic nevi, unspecified: Secondary | ICD-10-CM

## 2022-04-19 DIAGNOSIS — Z86018 Personal history of other benign neoplasm: Secondary | ICD-10-CM

## 2022-04-19 DIAGNOSIS — D485 Neoplasm of uncertain behavior of skin: Secondary | ICD-10-CM

## 2022-04-19 DIAGNOSIS — L821 Other seborrheic keratosis: Secondary | ICD-10-CM

## 2022-04-19 DIAGNOSIS — Z7189 Other specified counseling: Secondary | ICD-10-CM

## 2022-04-19 DIAGNOSIS — L720 Epidermal cyst: Secondary | ICD-10-CM | POA: Diagnosis not present

## 2022-04-19 DIAGNOSIS — L578 Other skin changes due to chronic exposure to nonionizing radiation: Secondary | ICD-10-CM

## 2022-04-19 DIAGNOSIS — L918 Other hypertrophic disorders of the skin: Secondary | ICD-10-CM

## 2022-04-19 DIAGNOSIS — Z85820 Personal history of malignant melanoma of skin: Secondary | ICD-10-CM

## 2022-04-19 DIAGNOSIS — L814 Other melanin hyperpigmentation: Secondary | ICD-10-CM

## 2022-04-19 DIAGNOSIS — L810 Postinflammatory hyperpigmentation: Secondary | ICD-10-CM | POA: Diagnosis not present

## 2022-04-19 DIAGNOSIS — D492 Neoplasm of unspecified behavior of bone, soft tissue, and skin: Secondary | ICD-10-CM

## 2022-04-19 MED ORDER — TRETINOIN 0.1 % EX CREA: TOPICAL_CREAM | CUTANEOUS | 2 refills | Status: DC

## 2022-04-19 NOTE — Progress Notes (Signed)
Follow-Up Visit   Subjective  Olivia Carpenter is a 61 y.o. female who presents for the following: Annual Exam. Hx of Melanoma, hx of Dysplastic nevus. The patient presents for Total-Body Skin Exam (TBSE) for skin cancer screening and mole check.  The patient has spots, moles and lesions to be evaluated, some may be new or changing and the patient has concerns that these could be cancer.   The following portions of the chart were reviewed this encounter and updated as appropriate:   Tobacco  Allergies  Meds  Problems  Med Hx  Surg Hx  Fam Hx     Review of Systems:  No other skin or systemic complaints except as noted in HPI or Assessment and Plan.  Objective  Well appearing patient in no apparent distress; mood and affect are within normal limits.  A full examination was performed including scalp, head, eyes, ears, nose, lips, neck, chest, axillae, abdomen, back, buttocks, bilateral upper extremities, bilateral lower extremities, hands, feet, fingers, toes, fingernails, and toenails. All findings within normal limits unless otherwise noted below.  left glabella Smooth white papule(s).   face Mid face erythema with telangiectasias   right antecubital 0.6 cm brown macule with hypopigmentation surrounding        Right Lower Leg - Anterior Hyperpigmented patches   Assessment & Plan  Milia left glabella Benign-appearing.  Observation.  Call clinic for new or changing lesions.  Recommend daily use of broad spectrum spf 30+ sunscreen to sun-exposed areas.    Rosacea face Discussed the treatment option of BBL/laser.  Typically we recommend 1-3 treatment sessions about 5-8 weeks apart for best results.  The patient's condition may require "maintenance treatments" in the future.  The fee for BBL / laser treatments is $350 per treatment session for the whole face.  A fee can be quoted for other parts of the body. Insurance typically does not pay for BBL/laser treatments and  therefore the fee is an out-of-pocket cost.   Neoplasm of skin right antecubital Epidermal / dermal shaving  Lesion diameter (cm):  0.6 Informed consent: discussed and consent obtained   Timeout: patient name, date of birth, surgical site, and procedure verified   Procedure prep:  Patient was prepped and draped in usual sterile fashion Prep type:  Isopropyl alcohol Anesthesia: the lesion was anesthetized in a standard fashion   Anesthetic:  1% lidocaine w/ epinephrine 1-100,000 buffered w/ 8.4% NaHCO3 Hemostasis achieved with: pressure, aluminum chloride and electrodesiccation   Outcome: patient tolerated procedure well   Post-procedure details: sterile dressing applied and wound care instructions given   Dressing type: bandage and petrolatum    Specimen 1 - Surgical pathology Differential Diagnosis: R/O Burn scar vs Halo nevus R/O Dysplastic nevus  Check Margins: No  Post-inflammatory hyperpigmentation Right Lower Leg - Anterior Start [facility] medicinals Hydroquinone 8% / Tretinoin 0.1% / Kojic Acid 1% / Niacinamide 4% / Fluocinolone 0.025% Cream- apply to affected leg once a day   Related Medications tretinoin (RETIN-A) 0.1 % cream Apply to discoloration on the right leg once a day  Lentigines - Scattered tan macules - Due to sun exposure - Benign-appearing, observe - Recommend daily broad spectrum sunscreen SPF 30+ to sun-exposed areas, reapply every 2 hours as needed. - Call for any changes  Seborrheic Keratoses - Stuck-on, waxy, tan-brown papules and/or plaques  - Benign-appearing - Discussed benign etiology and prognosis. - Observe - Call for any changes  Melanocytic Nevi - Tan-brown and/or pink-flesh-colored symmetric macules and papules -  Benign appearing on exam today - Observation - Call clinic for new or changing moles - Recommend daily use of broad spectrum spf 30+ sunscreen to sun-exposed areas.   Hemangiomas - Red papules - Discussed benign nature -  Observe - Call for any changes  Actinic Damage - Chronic condition, secondary to cumulative UV/sun exposure - diffuse scaly erythematous macules with underlying dyspigmentation - Recommend daily broad spectrum sunscreen SPF 30+ to sun-exposed areas, reapply every 2 hours as needed.  - Staying in the shade or wearing long sleeves, sun glasses (UVA+UVB protection) and wide brim hats (4-inch brim around the entire circumference of the hat) are also recommended for sun protection.  - Call for new or changing lesions.  Acrochordons (Skin Tags) Right axilla, left axilla  - Fleshy, skin-colored pedunculated papules - Benign appearing.  - Observe. - If desired, they can be removed with an in office procedure that is not covered by insurance. - Please call the clinic if you notice any new or changing lesions.   History of Dysplastic Nevi Multiple see history  - No evidence of recurrence today - Recommend regular full body skin exams - Recommend daily broad spectrum sunscreen SPF 30+ to sun-exposed areas, reapply every 2 hours as needed.  - Call if any new or changing lesions are noted between office visits   History of Melanoma Left forehead, right calf  - No evidence of recurrence today - No lymphadenopathy - Recommend regular full body skin exams - Recommend daily broad spectrum sunscreen SPF 30+ to sun-exposed areas, reapply every 2 hours as needed.  - Call if any new or changing lesions are noted between office visits   Skin cancer screening performed today.  Return in about 1 year (around 04/20/2023) for TBSE, hx of Melanoma .  IMarye Round, CMA, am acting as scribe for Sarina Ser, MD .  Documentation: I have reviewed the above documentation for accuracy and completeness, and I agree with the above.  Sarina Ser, MD

## 2022-04-19 NOTE — Patient Instructions (Addendum)
Discussed the treatment option of BBL/laser.  Typically we recommend 1-3 treatment sessions about 5-8 weeks apart for best results.  The patient's condition may require "maintenance treatments" in the future.  The fee for BBL / laser treatments is $350 per treatment session for the whole face.  A fee can be quoted for other parts of the body. Insurance typically does not pay for BBL/laser treatments and therefore the fee is an out-of-pocket cost. s     Instructions for [facility] Medications  One or more of your medications was sent to the [facility] mail order compounding pharmacy. You will receive an email from them and can purchase the medicine through that link. It will then be mailed to your home at the address you confirmed. If for any reason you do not receive an email from them, please check your spam folder. If you still do not find the email, please let us know. [facility] phone number is [phone].       Wound Care Instructions  Cleanse wound gently with soap and water once a day then pat dry with clean gauze. Apply a thin coat of Petrolatum (petroleum jelly, "Vaseline") over the wound (unless you have an allergy to this). We recommend that you use a new, sterile tube of Vaseline. Do not pick or remove scabs. Do not remove the yellow or white "healing tissue" from the base of the wound.  Cover the wound with fresh, clean, nonstick gauze and secure with paper tape. You may use Band-Aids in place of gauze and tape if the wound is small enough, but would recommend trimming much of the tape off as there is often too much. Sometimes Band-Aids can irritate the skin.  You should call the office for your biopsy report after 1 week if you have not already been contacted.  If you experience any problems, such as abnormal amounts of bleeding, swelling, significant bruising, significant pain, or evidence of infection, please call the office immediately.  FOR ADULT SURGERY  PATIENTS: If you need something for pain relief you may take 1 extra strength Tylenol (acetaminophen) AND 2 Ibuprofen ('200mg'$  each) together every 4 hours as needed for pain. (do not take these if you are allergic to them or if you have a reason you should not take them.) Typically, you may only need pain medication for 1 to 3 days.       Due to recent changes in healthcare laws, you may see results of your pathology and/or laboratory studies on MyChart before the doctors have had a chance to review them. We understand that in some cases there may be results that are confusing or concerning to you. Please understand that not all results are received at the same time and often the doctors may need to interpret multiple results in order to provide you with the best plan of care or course of treatment. Therefore, we ask that you please give [place] 2 business days to thoroughly review all your results before contacting the office for clarification. Should we see a critical lab result, you will be contacted sooner.   If You Need Anything After Your Visit  If you have any questions or concerns for your doctor, please call our main line at [phone] and press option 4 to reach your doctor's medical assistant. If no one answers, please leave a voicemail as directed and we will return your call as soon as possible. Messages left after 4 pm will be answered the following business day.  You may also send [place] a message via [place]. We typically respond to MyChart messages within 1-2 business days.  For prescription refills, please ask your pharmacy to contact our office. Our fax number is [phone].  If you have an urgent issue when the clinic is closed that cannot wait until the next business day, you can page your doctor at the number below.    Please note that while we do our best to be available for urgent issues outside of office hours, we are not available 24/7.   If you have an urgent issue and are  unable to reach [place], you may choose to seek medical care at your doctor's office, retail clinic, urgent care center, or emergency room.  If you have a medical emergency, please immediately call 911 or go to the emergency department.  Pager Numbers  - Dr. Nehemiah Massed: [phone]  - Dr. Laurence Ferrari: [phone]  - Dr. Nicole Kindred: [phone]  In the event of inclement weather, please call our main line at [phone] for an update on the status of any delays or closures.  Dermatology Medication Tips: Please keep the boxes that topical medications come in in order to help keep track of the instructions about where and how to use these. Pharmacies typically print the medication instructions only on the boxes and not directly on the medication tubes.   If your medication is too expensive, please contact our office at [phone] option 4 or send [place] a message through [place].   We are unable to tell what your co-pay for medications will be in advance as this is different depending on your insurance coverage. However, we may be able to find a substitute medication at lower cost or fill out paperwork to get insurance to cover a needed medication.   If a prior authorization is required to get your medication covered by your insurance company, please allow [place] 1-2 business days to complete this process.  Drug prices often vary depending on where the prescription is filled and some pharmacies may offer cheaper prices.  The website www.goodrx.com contains coupons for medications through different pharmacies. The prices here do not account for what the cost may be with help from insurance (it may be cheaper with your insurance), but the website can give you the price if you did not use any insurance.  - You can print the associated coupon and take it with your prescription to the pharmacy.  - You may also stop by our office during regular business hours and pick up a GoodRx coupon card.  - If you need your  prescription sent electronically to a different pharmacy, notify our office through [facility] or by phone at [phone] option 4.     Si Usted Necesita Algo Despus de Su Visita  Tambin puede enviarnos un mensaje a travs de Pharmacist, community. Por lo general respondemos a los mensajes de MyChart en el transcurso de 1 a 2 das hbiles.  Para renovar recetas, por favor pida a su farmacia que se ponga en contacto con nuestra oficina. Harland Dingwall de fax es [place] [phone].  Si tiene un asunto urgente cuando la clnica est cerrada y que no puede esperar hasta el siguiente da hbil, puede llamar/localizar a su doctor(a) al nmero que aparece a continuacin.   Por favor, tenga en cuenta que aunque hacemos todo lo posible para estar disponibles para asuntos urgentes fuera del horario de [place], no estamos disponibles las 24 horas del da, los 7 das de la [place].  Si tiene un problema urgente y no puede comunicarse con nosotros, puede optar por buscar atencin mdica  en el consultorio de su doctor(a), en una clnica privada, en un centro de atencin urgente o en una sala de emergencias.  Si tiene Engineering geologist, por favor llame inmediatamente al 911 o vaya a la sala de emergencias.  Nmeros de bper  - Dr. Nehemiah Massed: [phone]  - Dra. Moye: [phone]  - Dra. Nicole Kindred: [phone]  En caso de inclemencias del [place], por favor llame a Johnsie Kindred principal al [phone] para una actualizacin sobre el [place] de cualquier retraso o cierre.  Consejos para la medicacin en dermatologa: Por favor, guarde las cajas en las que vienen los medicamentos de uso tpico para ayudarle a seguir las instrucciones sobre dnde y cmo usarlos. Las farmacias generalmente imprimen las instrucciones del medicamento slo en las cajas y no directamente en los tubos del [place].   Si su medicamento es muy caro, por favor, pngase en contacto con Zigmund Daniel llamando al [phone]  y presione la opcin 4 o envenos un mensaje a travs de Pharmacist, community.   No podemos decirle cul ser su copago por los medicamentos por adelantado ya que esto es diferente dependiendo de la cobertura de su seguro. Sin embargo, es posible que podamos encontrar un medicamento sustituto a Electrical engineer un formulario para que el seguro cubra el medicamento que se considera necesario.   Si se requiere una autorizacin previa para que su compaa de seguros [place] su medicamento, por favor permtanos de 1 a 2 das hbiles para completar este proceso.  Los precios de los medicamentos varan con frecuencia dependiendo del Environmental consultant de dnde se surte la receta y alguna farmacias pueden ofrecer precios ms baratos.  El sitio web www.goodrx.com tiene cupones para medicamentos de Airline pilot. Los precios aqu no tienen en cuenta lo que podra costar con la ayuda del seguro (puede ser ms barato con su seguro), pero el sitio web puede darle el precio si no utiliz Research scientist (physical sciences).  - Puede imprimir el cupn correspondiente y llevarlo con su receta a la farmacia.  - Tambin puede pasar por nuestra oficina durante el horario de atencin regular y Charity fundraiser una tarjeta de cupones de GoodRx.  - Si necesita que su receta se enve electrnicamente a una farmacia diferente, informe a nuestra oficina a travs de MyChart de [place] o por telfono llamando al [phone] y presione la opcin 4.

## 2022-04-28 ENCOUNTER — Telehealth: Payer: Self-pay

## 2022-04-28 NOTE — Telephone Encounter (Signed)
LMOVM for patient to call back for results.

## 2022-04-28 NOTE — Telephone Encounter (Signed)
Patient advised. aw

## 2022-04-28 NOTE — Telephone Encounter (Signed)
-----   Message from Ralene Bathe, MD sent at 04/28/2022 12:29 PM EST ----- Diagnosis Skin , right antecubital PIGMENTED SEBORRHEIC KERATOSIS, EARLY  Benign keratosis No further treatment needed

## 2022-04-30 ENCOUNTER — Encounter: Payer: Self-pay | Admitting: Dermatology

## 2022-07-06 ENCOUNTER — Encounter: Payer: Self-pay | Admitting: Gastroenterology

## 2022-08-06 DIAGNOSIS — S83232A Complex tear of medial meniscus, current injury, left knee, initial encounter: Secondary | ICD-10-CM | POA: Insufficient documentation

## 2022-08-06 DIAGNOSIS — M1712 Unilateral primary osteoarthritis, left knee: Secondary | ICD-10-CM | POA: Insufficient documentation

## 2022-08-06 DIAGNOSIS — M23301 Other meniscus derangements, unspecified lateral meniscus, left knee: Secondary | ICD-10-CM | POA: Insufficient documentation

## 2022-08-18 ENCOUNTER — Other Ambulatory Visit: Payer: Self-pay | Admitting: Surgery

## 2022-08-18 ENCOUNTER — Other Ambulatory Visit: Payer: Self-pay | Admitting: Physician Assistant

## 2022-08-18 DIAGNOSIS — R1011 Right upper quadrant pain: Secondary | ICD-10-CM

## 2022-08-24 ENCOUNTER — Encounter
Admission: RE | Admit: 2022-08-24 | Discharge: 2022-08-24 | Disposition: A | Discharge status: Home / Self Care | Payer: PRIVATE HEALTH INSURANCE | Source: Ambulatory Visit | Attending: Surgery | Admitting: Surgery

## 2022-08-24 ENCOUNTER — Other Ambulatory Visit: Payer: Self-pay

## 2022-08-24 DIAGNOSIS — I10 Essential (primary) hypertension: Secondary | ICD-10-CM | POA: Insufficient documentation

## 2022-08-24 DIAGNOSIS — Z01810 Encounter for preprocedural cardiovascular examination: Secondary | ICD-10-CM | POA: Diagnosis not present

## 2022-08-24 NOTE — Patient Instructions (Addendum)
Your procedure is scheduled on: 08/31/22 -Tuesday Report to the Registration Desk on the 1st floor of the [facility]. To find out your arrival time, please call [phone] between 1PM - 3PM on: 08/30/22 - Monday If your arrival time is 6:00 am, do not arrive before that time as the [facility] entrance doors do not open until 6:00 am.  REMEMBER: Instructions that are not followed completely may result in serious medical risk, up to and including death; or upon the discretion of your surgeon and anesthesiologist your surgery may need to be rescheduled.  Do not eat food after midnight the night before surgery.  No gum chewing or hard candies.  You may however, drink CLEAR liquids up to 2 hours before you are scheduled to arrive for your surgery. Do not drink anything within 2 hours of your scheduled arrival time.  Clear liquids include: - water  - apple juice without pulp - gatorade (not RED colors) - black coffee or tea (Do NOT add milk or creamers to the coffee or tea) Do NOT drink anything that is not on this list.  In addition, your doctor has ordered for you to drink the provided:  Ensure Pre-Surgery Clear Carbohydrate Drink  Drinking this carbohydrate drink up to two hours before surgery helps to reduce insulin resistance and improve patient outcomes. Please complete drinking 2 hours before scheduled arrival time.  One week prior to surgery: Stop Anti-inflammatories (NSAIDS) such as Advil, Aleve, Ibuprofen, Motrin, Naproxen, Naprosyn and Aspirin based products such as Excedrin, Goody's Powder, BC Powder.   Stop ANY OVER THE COUNTER supplements until after surgery.  You may take Tylenol if needed for pain up until the day of surgery.   TAKE ONLY THESE MEDICATIONS THE MORNING OF SURGERY WITH A SIP OF WATER:  NONE   No Alcohol for 24 hours before or after surgery.  No Smoking including e-cigarettes for 24 hours before surgery.  No chewable tobacco products for at least  6 hours before surgery.  No nicotine patches on the day of surgery.  Do not use any "recreational" drugs for at least a week (preferably 2 weeks) before your surgery.  Please be advised that the combination of cocaine and anesthesia may have negative outcomes, up to and including death. If you test positive for cocaine, your surgery will be cancelled.  On the morning of surgery brush your teeth with toothpaste and water, you may rinse your mouth with mouthwash if you wish. Do not swallow any toothpaste or mouthwash.  Use CHG Soap or wipes as directed on instruction sheet.  Do not wear jewelry, make-up, hairpins, clips or nail polish.  Do not wear lotions, powders, or perfumes.   Do not shave body hair from the neck down 48 hours before surgery.  Contact lenses, hearing aids and dentures may not be worn into surgery.  Do not bring valuables to the hospital. [facility] is not responsible for any missing/lost belongings or valuables.   Notify your doctor if there is any change in your medical condition (cold, fever, infection).  Wear comfortable clothing (specific to your surgery type) to the hospital.  After surgery, you can help prevent lung complications by doing breathing exercises.  Take deep breaths and cough every 1-2 hours. Your doctor may order a device called an Incentive Spirometer to help you take deep breaths. When coughing or sneezing, hold a pillow firmly against your incision with both hands. This is called "splinting." Doing this helps protect your incision.  It also decreases belly discomfort.  If you are being admitted to the hospital overnight, leave your suitcase in the car. After surgery it may be brought to your room.  In case of increased patient census, it may be necessary for you, the patient, to continue your postoperative care in the Same Day Surgery department.  If you are being discharged the day of surgery, you will not be allowed to drive home. You will  need a responsible individual to drive you home and stay with you for 24 hours after surgery.   If you are taking public transportation, you will need to have a responsible individual with you.  Please call the Pre-admissions Testing Dept. at [phone] if you have any questions about these instructions.  Surgery Visitation Policy:  Patients having surgery or a procedure may have two visitors.  Children under the age of 11 must have an adult with them who is not the patient.  Inpatient Visitation:    Visiting hours are 7 a.m. to 8 p.m. Up to four visitors are allowed at one time in a patient room. The visitors may rotate out with other people during the day.  One visitor age 64 or older may stay with the patient overnight and must be in the room by 8 p.m.    Preparing for Surgery with CHLORHEXIDINE GLUCONATE (CHG) Soap  Chlorhexidine Gluconate (CHG) Soap  o An antiseptic cleaner that kills germs and bonds with the skin to continue killing germs even after washing  o Used for showering the night before surgery and morning of surgery  Before surgery, you can play an important role by reducing the number of germs on your skin.  CHG (Chlorhexidine gluconate) soap is an antiseptic cleanser which kills germs and bonds with the skin to continue killing germs even after washing.  Please do not use if you have an allergy to CHG or antibacterial soaps. If your skin becomes reddened/irritated stop using the CHG.  1. Shower the NIGHT BEFORE SURGERY and the MORNING OF SURGERY with CHG soap.  2. If you choose to wash your hair, wash your hair first as usual with your normal shampoo.  3. After shampooing, rinse your hair and body thoroughly to remove the shampoo.  4. Use CHG as you would any other liquid soap. You can apply CHG directly to the skin and wash gently with a scrungie or a clean washcloth.  5. Apply the CHG soap to your body only from the neck down. Do not use on open wounds  or open sores. Avoid contact with your eyes, ears, mouth, and genitals (private parts). Wash face and genitals (private parts) with your normal soap.  6. Wash thoroughly, paying special attention to the area where your surgery will be performed.  7. Thoroughly rinse your body with warm water.  8. Do not shower/wash with your normal soap after using and rinsing off the CHG soap.  9. Pat yourself dry with a clean towel.  10. Wear clean pajamas to bed the night before surgery.  12. Place clean sheets on your bed the night of your first shower and do not sleep with pets.  13. Shower again with the CHG soap on the day of surgery prior to arriving at the hospital.  14. Do not apply any deodorants/lotions/powders.  15. Please wear clean clothes to the hospital. How to Use an Incentive Spirometer  An incentive spirometer is a tool that measures how well you are filling your lungs with  each breath. Learning to take long, deep breaths using this tool can help you keep your lungs clear and active. This may help to reverse or lessen your chance of developing breathing (pulmonary) problems, especially infection. You may be asked to use a spirometer: After a surgery. If you have a lung problem or a history of smoking. After a long period of time when you have been unable to move or be active. If the spirometer includes an indicator to show the highest number that you have reached, your health care provider or respiratory therapist will help you set a goal. Keep a log of your progress as told by your health care provider. What are the risks? Breathing too quickly may cause dizziness or cause you to pass out. Take your time so you do not get dizzy or light-headed. If you are in pain, you may need to take pain medicine before doing incentive spirometry. It is harder to take a deep breath if you are having pain. How to use your incentive spirometer  Sit up on the edge of your bed or on a chair. Hold  the incentive spirometer so that it is in an upright position. Before you use the spirometer, breathe out normally. Place the mouthpiece in your mouth. Make sure your lips are closed tightly around it. Breathe in slowly and as deeply as you can through your mouth, causing the piston or the ball to rise toward the top of the chamber. Hold your breath for 3-5 seconds, or for as long as possible. If the spirometer includes a coach indicator, use this to guide you in breathing. Slow down your breathing if the indicator goes above the marked areas. Remove the mouthpiece from your mouth and breathe out normally. The piston or ball will return to the bottom of the chamber. Rest for a few seconds, then repeat the steps 10 or more times. Take your time and take a few normal breaths between deep breaths so that you do not get dizzy or light-headed. Do this every 1-2 hours when you are awake. If the spirometer includes a goal marker to show the highest number you have reached (best effort), use this as a goal to work toward during each repetition. After each set of 10 deep breaths, cough a few times. This will help to make sure that your lungs are clear. If you have an incision on your chest or abdomen from surgery, place a pillow or a rolled-up towel firmly against the incision when you cough. This can help to reduce pain while taking deep breaths and coughing. General tips When you are able to get out of bed: Walk around often. Continue to take deep breaths and cough in order to clear your lungs. Keep using the incentive spirometer until your health care provider says it is okay to stop using it. If you have been in the hospital, you may be told to keep using the spirometer at home. Contact a health care provider if: You are having difficulty using the spirometer. You have trouble using the spirometer as often as instructed. Your pain medicine is not giving enough relief for you to use the spirometer as  told. You have a fever. Get help right away if: You develop shortness of breath. You develop a cough with bloody mucus from the lungs. You have fluid or blood coming from an incision site after you cough. Summary An incentive spirometer is a tool that can help you learn to take long, deep breaths  to keep your lungs clear and active. You may be asked to use a spirometer after a surgery, if you have a lung problem or a history of smoking, or if you have been inactive for a long period of time. Use your incentive spirometer as instructed every 1-2 hours while you are awake. If you have an incision on your chest or abdomen, place a pillow or a rolled-up towel firmly against your incision when you cough. This will help to reduce pain. Get help right away if you have shortness of breath, you cough up bloody mucus, or blood comes from your incision when you cough. This information is not intended to replace advice given to you by your health care provider. Make sure you discuss any questions you have with your health care provider. Document Revised: 07/09/2019 Document Reviewed: 07/09/2019 Elsevier Patient Education  2023 [employer].

## 2022-08-25 ENCOUNTER — Ambulatory Visit
Admission: RE | Admit: 2022-08-25 | Discharge: 2022-08-25 | Disposition: A | Discharge status: Home / Self Care | Payer: PRIVATE HEALTH INSURANCE | Source: Ambulatory Visit | Attending: Physician Assistant | Admitting: Physician Assistant

## 2022-08-25 DIAGNOSIS — R1011 Right upper quadrant pain: Secondary | ICD-10-CM | POA: Diagnosis present

## 2022-08-26 ENCOUNTER — Encounter: Payer: Self-pay | Admitting: Gastroenterology

## 2022-08-30 ENCOUNTER — Ambulatory Visit: Payer: PRIVATE HEALTH INSURANCE | Admitting: Gastroenterology

## 2022-08-31 ENCOUNTER — Encounter
Admission: RE | Disposition: A | Discharge status: Home / Self Care | Payer: Self-pay | Source: Ambulatory Visit | Attending: Surgery

## 2022-08-31 ENCOUNTER — Ambulatory Visit: Payer: PRIVATE HEALTH INSURANCE | Admitting: Anesthesiology

## 2022-08-31 ENCOUNTER — Other Ambulatory Visit: Payer: Self-pay

## 2022-08-31 ENCOUNTER — Ambulatory Visit
Admission: RE | Admit: 2022-08-31 | Discharge: 2022-08-31 | Disposition: A | Discharge status: Home / Self Care | Payer: PRIVATE HEALTH INSURANCE | Source: Ambulatory Visit | Attending: Surgery | Admitting: Surgery

## 2022-08-31 ENCOUNTER — Encounter: Payer: Self-pay | Admitting: Surgery

## 2022-08-31 ENCOUNTER — Ambulatory Visit: Payer: PRIVATE HEALTH INSURANCE | Admitting: Urgent Care

## 2022-08-31 DIAGNOSIS — Y9301 Activity, walking, marching and hiking: Secondary | ICD-10-CM | POA: Insufficient documentation

## 2022-08-31 DIAGNOSIS — Z8249 Family history of ischemic heart disease and other diseases of the circulatory system: Secondary | ICD-10-CM | POA: Diagnosis not present

## 2022-08-31 DIAGNOSIS — M069 Rheumatoid arthritis, unspecified: Secondary | ICD-10-CM | POA: Diagnosis not present

## 2022-08-31 DIAGNOSIS — Z8261 Family history of arthritis: Secondary | ICD-10-CM | POA: Insufficient documentation

## 2022-08-31 DIAGNOSIS — I10 Essential (primary) hypertension: Secondary | ICD-10-CM | POA: Diagnosis not present

## 2022-08-31 DIAGNOSIS — S83232A Complex tear of medial meniscus, current injury, left knee, initial encounter: Secondary | ICD-10-CM | POA: Insufficient documentation

## 2022-08-31 DIAGNOSIS — M94262 Chondromalacia, left knee: Secondary | ICD-10-CM | POA: Diagnosis not present

## 2022-08-31 DIAGNOSIS — Z79899 Other long term (current) drug therapy: Secondary | ICD-10-CM | POA: Diagnosis not present

## 2022-08-31 DIAGNOSIS — M1712 Unilateral primary osteoarthritis, left knee: Secondary | ICD-10-CM | POA: Diagnosis not present

## 2022-08-31 DIAGNOSIS — M6752 Plica syndrome, left knee: Secondary | ICD-10-CM | POA: Diagnosis not present

## 2022-08-31 DIAGNOSIS — S83282A Other tear of lateral meniscus, current injury, left knee, initial encounter: Secondary | ICD-10-CM | POA: Insufficient documentation

## 2022-08-31 HISTORY — PX: KNEE ARTHROSCOPY: SHX127

## 2022-08-31 SURGERY — ARTHROSCOPY, KNEE
Anesthesia: General | Site: Knee | Laterality: Left

## 2022-08-31 MED ORDER — CEFAZOLIN SODIUM-DEXTROSE 2-4 GM/100ML-% IV SOLN
2.0000 g | INTRAVENOUS | Status: AC
  Administered 2022-08-31: 2 g via INTRAVENOUS

## 2022-08-31 MED ORDER — BUPIVACAINE HCL (PF) 0.5 % IJ SOLN
INTRAMUSCULAR | Status: AC
  Filled 2022-08-31: qty 30

## 2022-08-31 MED ORDER — LIDOCAINE HCL (CARDIAC) PF 100 MG/5ML IV SOSY
PREFILLED_SYRINGE | INTRAVENOUS | Status: DC | PRN
  Administered 2022-08-31: 80 mg via INTRAVENOUS

## 2022-08-31 MED ORDER — OXYCODONE HCL 5 MG/5ML PO SOLN: 5.0000 mg | Freq: Once | ORAL | Status: DC | PRN

## 2022-08-31 MED ORDER — TRAMADOL HCL 50 MG PO TABS: 50.0000 mg | ORAL_TABLET | Freq: Four times a day (QID) | ORAL | 0 refills | Status: DC | PRN

## 2022-08-31 MED ORDER — FAMOTIDINE 20 MG PO TABS
ORAL_TABLET | ORAL | Status: AC
  Filled 2022-08-31: qty 1

## 2022-08-31 MED ORDER — DEXAMETHASONE SODIUM PHOSPHATE 10 MG/ML IJ SOLN
INTRAMUSCULAR | Status: DC | PRN
  Administered 2022-08-31: 10 mg via INTRAVENOUS

## 2022-08-31 MED ORDER — ONDANSETRON HCL 4 MG/2ML IJ SOLN
INTRAMUSCULAR | Status: DC | PRN
  Administered 2022-08-31: 4 mg via INTRAVENOUS

## 2022-08-31 MED ORDER — METOCLOPRAMIDE HCL 5 MG/ML IJ SOLN: 5.0000 mg | Freq: Three times a day (TID) | INTRAMUSCULAR | Status: DC | PRN

## 2022-08-31 MED ORDER — KETOROLAC TROMETHAMINE 30 MG/ML IJ SOLN
INTRAMUSCULAR | Status: AC
  Filled 2022-08-31: qty 1

## 2022-08-31 MED ORDER — FENTANYL CITRATE (PF) 100 MCG/2ML IJ SOLN
25.0000 ug | INTRAMUSCULAR | Status: DC | PRN
  Administered 2022-08-31: 25 ug via INTRAVENOUS

## 2022-08-31 MED ORDER — TRAMADOL HCL 50 MG PO TABS: 50.0000 mg | ORAL_TABLET | Freq: Four times a day (QID) | ORAL | Status: DC | PRN

## 2022-08-31 MED ORDER — LACTATED RINGERS IV SOLN: INTRAVENOUS | Status: DC

## 2022-08-31 MED ORDER — METOCLOPRAMIDE HCL 10 MG PO TABS: 5.0000 mg | ORAL_TABLET | Freq: Three times a day (TID) | ORAL | Status: DC | PRN

## 2022-08-31 MED ORDER — LIDOCAINE HCL 1 % IJ SOLN
INTRAMUSCULAR | Status: DC | PRN
  Administered 2022-08-31: 30 mL

## 2022-08-31 MED ORDER — MIDAZOLAM HCL 2 MG/2ML IJ SOLN
INTRAMUSCULAR | Status: DC | PRN
  Administered 2022-08-31: 2 mg via INTRAVENOUS

## 2022-08-31 MED ORDER — MIDAZOLAM HCL 2 MG/2ML IJ SOLN
INTRAMUSCULAR | Status: AC
  Filled 2022-08-31: qty 2

## 2022-08-31 MED ORDER — KETOROLAC TROMETHAMINE 30 MG/ML IJ SOLN
30.0000 mg | Freq: Once | INTRAMUSCULAR | Status: AC
  Administered 2022-08-31: 30 mg via INTRAVENOUS

## 2022-08-31 MED ORDER — CHLORHEXIDINE GLUCONATE 0.12 % MT SOLN
15.0000 mL | Freq: Once | OROMUCOSAL | Status: AC
  Administered 2022-08-31: 15 mL via OROMUCOSAL

## 2022-08-31 MED ORDER — BUPIVACAINE-EPINEPHRINE (PF) 0.5% -1:200000 IJ SOLN
INTRAMUSCULAR | Status: DC | PRN
  Administered 2022-08-31 (×2): 30 mL

## 2022-08-31 MED ORDER — FENTANYL CITRATE (PF) 100 MCG/2ML IJ SOLN
INTRAMUSCULAR | Status: AC
  Filled 2022-08-31: qty 2

## 2022-08-31 MED ORDER — EPINEPHRINE PF 1 MG/ML IJ SOLN
INTRAMUSCULAR | Status: AC
  Filled 2022-08-31: qty 1

## 2022-08-31 MED ORDER — EPHEDRINE SULFATE (PRESSORS) 50 MG/ML IJ SOLN
INTRAMUSCULAR | Status: DC | PRN
  Administered 2022-08-31 (×3): 5 mg via INTRAVENOUS

## 2022-08-31 MED ORDER — FENTANYL CITRATE (PF) 100 MCG/2ML IJ SOLN
INTRAMUSCULAR | Status: DC | PRN
  Administered 2022-08-31: 100 ug via INTRAVENOUS

## 2022-08-31 MED ORDER — CHLORHEXIDINE GLUCONATE 0.12 % MT SOLN
OROMUCOSAL | Status: AC
  Filled 2022-08-31: qty 15

## 2022-08-31 MED ORDER — ACETAMINOPHEN 10 MG/ML IV SOLN
INTRAVENOUS | Status: DC | PRN
  Administered 2022-08-31: 1000 mg via INTRAVENOUS

## 2022-08-31 MED ORDER — OXYCODONE HCL 5 MG PO TABS: 5.0000 mg | ORAL_TABLET | Freq: Once | ORAL | Status: DC | PRN

## 2022-08-31 MED ORDER — ONDANSETRON HCL 4 MG PO TABS: 4.0000 mg | ORAL_TABLET | Freq: Four times a day (QID) | ORAL | Status: DC | PRN

## 2022-08-31 MED ORDER — CEFAZOLIN SODIUM-DEXTROSE 2-4 GM/100ML-% IV SOLN
INTRAVENOUS | Status: AC
  Filled 2022-08-31: qty 100

## 2022-08-31 MED ORDER — RINGERS IRRIGATION IR SOLN
Status: DC | PRN
  Administered 2022-08-31: 4000 mL

## 2022-08-31 MED ORDER — PHENYLEPHRINE HCL (PRESSORS) 10 MG/ML IV SOLN
INTRAVENOUS | Status: DC | PRN
  Administered 2022-08-31 (×2): 80 ug via INTRAVENOUS

## 2022-08-31 MED ORDER — ACETAMINOPHEN 10 MG/ML IV SOLN
INTRAVENOUS | Status: AC
  Filled 2022-08-31: qty 100

## 2022-08-31 MED ORDER — FAMOTIDINE 20 MG PO TABS
20.0000 mg | ORAL_TABLET | Freq: Once | ORAL | Status: AC
  Administered 2022-08-31: 20 mg via ORAL

## 2022-08-31 MED ORDER — ONDANSETRON HCL 4 MG/2ML IJ SOLN: 4.0000 mg | Freq: Four times a day (QID) | INTRAMUSCULAR | Status: DC | PRN

## 2022-08-31 MED ORDER — ORAL CARE MOUTH RINSE: 15.0000 mL | Freq: Once | OROMUCOSAL | Status: AC

## 2022-08-31 MED ORDER — LIDOCAINE HCL (PF) 1 % IJ SOLN
INTRAMUSCULAR | Status: AC
  Filled 2022-08-31: qty 30

## 2022-08-31 MED ORDER — PROPOFOL 10 MG/ML IV BOLUS
INTRAVENOUS | Status: DC | PRN
  Administered 2022-08-31: 80 mg via INTRAVENOUS

## 2022-08-31 MED ORDER — SODIUM CHLORIDE 0.9 % IV SOLN: INTRAVENOUS | Status: DC

## 2022-08-31 SURGICAL SUPPLY — 38 items
APL PRP STRL LF DISP 70% ISPRP (MISCELLANEOUS) ×1
BLADE FULL RADIUS 3.5 (BLADE) ×1 IMPLANT
BLADE SHAVER 4.5X7 STR FR (MISCELLANEOUS) ×1 IMPLANT
BNDG CMPR 5X62 HK CLSR LF (GAUZE/BANDAGES/DRESSINGS) ×1
BNDG CMPR 6"X 5 YARDS HK CLSR (GAUZE/BANDAGES/DRESSINGS) ×1
BNDG ELASTIC 6INX 5YD STR LF (GAUZE/BANDAGES/DRESSINGS) ×1 IMPLANT
BNDG ESMARCH 6 X 12 STRL LF (GAUZE/BANDAGES/DRESSINGS) ×1
BNDG ESMARCH 6X12 STRL LF (GAUZE/BANDAGES/DRESSINGS) ×1 IMPLANT
CATH ROBINSON RED A/P 12FR (CATHETERS) IMPLANT
CHLORAPREP W/TINT 26 (MISCELLANEOUS) ×1 IMPLANT
COLLECTOR GRAFT TISSUE (SYSTAGENIX WOUND MANAGEMENT) ×1
CUP MEDICINE 2OZ PLAST GRAD ST (MISCELLANEOUS) ×1 IMPLANT
DRAPE ARTHRO LIMB 89X125 STRL (DRAPES) ×1 IMPLANT
DRAPE IMP U-DRAPE 54X76 (DRAPES) ×1 IMPLANT
ELECT REM PT RETURN 9FT ADLT (ELECTROSURGICAL) ×1
ELECTRODE REM PT RTRN 9FT ADLT (ELECTROSURGICAL) ×1 IMPLANT
GAUZE SPONGE 4X4 12PLY STRL (GAUZE/BANDAGES/DRESSINGS) ×1 IMPLANT
GLOVE BIO SURGEON STRL SZ8 (GLOVE) ×2 IMPLANT
GLOVE INDICATOR 8.0 STRL GRN (GLOVE) ×1 IMPLANT
GOWN STRL REUS W/ TWL LRG LVL3 (GOWN DISPOSABLE) ×1 IMPLANT
GOWN STRL REUS W/ TWL XL LVL3 (GOWN DISPOSABLE) ×2 IMPLANT
GOWN STRL REUS W/TWL LRG LVL3 (GOWN DISPOSABLE) ×1
GOWN STRL REUS W/TWL XL LVL3 (GOWN DISPOSABLE) ×2
IV LACTATED RINGER IRRG 3000ML (IV SOLUTION) ×1
IV LR IRRIG 3000ML ARTHROMATIC (IV SOLUTION) ×1 IMPLANT
KIT TURNOVER KIT A (KITS) ×1 IMPLANT
MANIFOLD NEPTUNE II (INSTRUMENTS) ×2 IMPLANT
NDL HYPO 21X1.5 SAFETY (NEEDLE) ×1 IMPLANT
NEEDLE HYPO 21X1.5 SAFETY (NEEDLE) ×1 IMPLANT
PACK ARTHROSCOPY KNEE (MISCELLANEOUS) ×1 IMPLANT
SUT PROLENE 4 0 PS 2 18 (SUTURE) ×1 IMPLANT
SYR 30ML LL (SYRINGE) ×1 IMPLANT
SYR 50ML LL SCALE MARK (SYRINGE) ×1 IMPLANT
SYR TOOMEY 50ML (SYRINGE) IMPLANT
TISSUE GRAFT COLLECTOR (SYSTAGENIX WOUND MANAGEMENT) IMPLANT
TUBE SET DOUBLEFLO INFLOW (TUBING) ×1 IMPLANT
WAND WEREWOLF FLOW 90D (MISCELLANEOUS) ×1 IMPLANT
WATER STERILE IRR 500ML POUR (IV SOLUTION) ×1 IMPLANT

## 2022-08-31 NOTE — Op Note (Signed)
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  Postoperative diagnosis:   Same with symptomatic suprapatellar plica, left knee.  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  Surgeon:   Maryagnes Amos, MD  Anesthesia:   General LMA  Findings:   As above. There was an unstable flap tear involving the posterior horn of the medial meniscus, as well as degenerative fraying of the central most portion of the lateral meniscus. 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 The anterior posterior cruciate ligaments both were in satisfactory condition. Lesser degenerative changes of the medial and lateral tibial plateaus and patella surfaces also were noted.  Complications:   None  EBL:   3 cc.  Total fluids:   900 cc of crystalloid.  Tourniquet time:   None  Drains:   None  Closure:   4-0 Prolene interrupted sutures.  Brief clinical note:   The patient is a 62 year old female with a 1 year history of medial sided left knee pain. The symptoms have persisted despite medications, activity modification, steroid injections, etc. The patient's history and physical examination are consistent with a medial meniscus tear with underlying degenerative joint disease, all of which were confirmed by a preoperative MRI scan.  The scan also demonstrated degenerative tearing of the lateral meniscus. The patient presents at this time for arthroscopy, debridement, and partial medial and lateral  meniscectomies.  Procedure:   The patient was brought into the operating room and lain in the supine position. After adequate general laryngeal mask anesthesia was obtained, a timeout was performed to verify the appropriate side. The patient's left knee was injected sterilely using a solution of 30 cc of 1% lidocaine and 30 cc of 0.5% Sensorcaine with epinephrine. The left lower extremity was prepped with ChloraPrep solution before being draped sterilely. Preoperative antibiotics were administered. The expected portal sites were injected with 0.5% Sensorcaine with epinephrine before the camera was placed in the anterolateral portal and instrumentation performed through the anteromedial portal.   The knee was sequentially examined beginning in the suprapatellar pouch, then progressing to the patellofemoral space, the medial gutter and compartment, the notch, and finally the lateral compartment and gutter. The findings were as described above. Abundant reactive synovial tissues anteriorly were debrided using the full-radius resector in order to improve visualization. These tissues were collected using the Arthrex graft net system and kept on the back table for later reimplantation. The medial and lateral menisci were carefully probed with the findings as described above. The unstable portions of the medial meniscus were debrided back to stable margins using a combination of the small baskets and full-radius resector. Subsequent probing of the remaining rim demonstrated excellent stability. The frayed portion of the lateral meniscus also was debrided back to stable margins using the small baskets and full-radius resector. Again, probing of the remaining rim demonstrated excellent stability.   Areas of grade III chondromalacia involving the weightbearing portion of the medial lateral femoral condyles, as well as of the femoral trochlea were debrided back to stable margins using the full-radius resector. In  addition,  a suprapatellar plica was identified medially and debrided back to stable margins as it appeared to be abrading the medial edge of the medial femoral condyle. The instruments were removed from the joint after suctioning the excess fluid.   The portal sites were closed using 4-0 Prolene interrupted sutures before a sterile bulky dressing was applied to the knee. Prior to closing the more lateral portal, the harvested fat cells were reinjected into the joint via a red rubber catheter using a Toomey syringe. The patient was then awakened, extubated, and returned to the recovery room in satisfactory condition after tolerating the procedure well.

## 2022-08-31 NOTE — Discharge Instructions (Addendum)
Orthopedic discharge instructions: Keep dressing dry and intact.  May shower after dressing changed on post-op day #4 (Saturday).  Cover sutures with Band-Aids after drying off. Apply ice frequently to knee. Take ibuprofen 800 mg TID with meals for 5-7 days, then as necessary. Take pain medication as prescribed or ES Tylenol when needed.  May weight-bear as tolerated - use crutches or walker as needed. Follow-up in 10-14 days or as scheduled.  AMBULATORY SURGERY  DISCHARGE INSTRUCTIONS   The drugs that you were given will stay in your system until tomorrow so for the next 24 hours you should not:  Drive an automobile Make any legal decisions Drink any alcoholic beverage   You may resume regular meals tomorrow.  Today it is better to start with liquids and gradually work up to solid foods.  You may eat anything you prefer, but it is better to start with liquids, then soup and crackers, and gradually work up to solid foods.   Please notify your doctor immediately if you have any unusual bleeding, trouble breathing, redness and pain at the surgery site, drainage, fever, or pain not relieved by medication.    Additional Instructions:        Please contact your physician with any problems or Same Day Surgery at [phone], Monday through Friday 6 am to 4 pm, or [place] [facility] [place] Main number at [phone].  

## 2022-08-31 NOTE — Transfer of Care (Signed)
Immediate Anesthesia Transfer of Care Note  Patient: Olivia Carpenter  Procedure(s) Performed: ARTHROSCOPIC DEBRIDEMENT WITH ABRASION CHONDROPLASTY, PARTIAL MEDIAL LATERAL MENISCECTOMIES, AND INJECTION OF HARVESTED INFRAPATELLAR FAT CELLS OF LEFT KNEE (Left: Knee)  Patient Location: PACU  Anesthesia Type:General  Level of Consciousness: awake, alert , and oriented  Airway & Oxygen Therapy: Patient Spontanous Breathing  Post-op Assessment: Report given to RN and Post -op Vital signs reviewed and stable  Post vital signs: stable  Last Vitals:  Vitals Value Taken Time  BP 123/65 08/31/22 1243  Temp    Pulse 72 08/31/22 1245  Resp 16 08/31/22 1245  SpO2 95 % 08/31/22 1245  Vitals shown include unvalidated device data.  Last Pain:  Vitals:   08/31/22 0903  TempSrc: Temporal  PainSc: 0-No pain         Complications: No notable events documented.

## 2022-08-31 NOTE — H&P (Signed)
History of Present Illness: Olivia Carpenter is a 62 y.o.female who is being seen for a new patient visit for left knee pain. The symptoms began suddenly about a year ago . 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 However, she does not recall any specific twisting injury or other event that may have precipitated the onset of the symptoms. She saw Baldwin Jamaica, PA-C, who diagnosed her with patellofemoral arthrosis of the knee and started in physical therapy. Because of continued symptoms, she was given a steroid injection which she states provided little if any relief of her symptoms. Therefore, the patient was sent for an MRI scan but no further treatment was initiated. She saw Van Clines, PA, in February, 2024. He reviewed the MRI findings with her and performed a repeat steroid injection which she states provided little if any relief of her symptoms. Therefore, the patient was referred to me for further evaluation and treatment. She reports 7/10 pain. The pain is located along the medial aspect of the knee. The pain is described as aching, dull, stabbing, and throbbing. The symptoms are aggravated with sleeping, using stairs, at higher levels of activity, rising from a chair, walking, standing, and standing pivot. She also describes occasional episodes of her knee "catching". She has no associated swelling and no deformity. She has tried acetaminophen, over-the-counter medications, steroid injections, a home exercise program, and ice with limited benefit.  Current Outpatient Medications: cholecalciferol (VITAMIN D3) 1000 unit capsule Take 1,000 Units by mouth once daily  ENBREL SURECLICK 50 mg/mL (1 mL) pen injector INJECT 1 PEN UNDER THE SKIN EVERY 7 DAYS. 12 mL 3  hydrOXYchloroQUINE (PLAQUENIL) 200 mg tablet Take 1 tablet (200 mg total) by mouth 2 (two) times daily 180 tablet 3  lisinopril-hydrochlorothiazide (PRINZIDE,ZESTORETIC) 10-12.5 mg  tablet Take 1 tablet by mouth once daily  meloxicam (MOBIC) 15 MG tablet TAKE 1 TABLET BY MOUTH ONCE DAILY AS NEEDED FOR ARTHRITIS PAIN  sennosides-docusate (SENOKOT-S) 8.6-50 mg tablet Take 1 tablet by mouth once daily  tretinoin (RETIN-A) 0.1 % cream Apply topically at bedtime Apply to discoloration on the right leg once a day   Allergies:  Grass Pollen Headache  Tree And Shrub Pollen Headache  Weed Pollen Headache   Past Medical History:  Allergy 2013 (started taking shots again in August 2020)  Arthritis (Right hand)  Broken ribs 06/16/2021  Chicken pox  History of cancer 2007 (had 2 melanomas)  History of malignant melanoma  Hyperlipidemia  Hypertension   Past Surgical History:  Right knee arthroscopy, partial medial meniscectomy 08/31/2017 (Dr Ernest Pine)  BUNION CORRECTION Bilateral  CESAREAN SECTION  COSMETIC SURGERY 2000 (breast lift and augmentatio)n  WISDOM TEETH   Family History:  High blood pressure (Hypertension) Mother  Lung cancer Mother  High blood pressure (Hypertension) Father  Gout Father  Osteoarthritis Father  Colon cancer Brother  Diabetes type II Brother  Cancer Brother  Had rectal cancer. Passed away October 10, 2019   Social History:   Socioeconomic History:  Marital status: Married  Spouse name: Ray  Number of children: 2  Years of education: 12  Highest education level: High school graduate  Occupational History  Occupation: Unemployed  Tobacco Use  Smoking status: Never  Smokeless tobacco: Never  Vaping Use  Vaping Use: Never used  Substance and Sexual Activity  Alcohol use: Never  Drug use: Never  Sexual activity: Yes  Partners: Male  Birth control/protection: Post-menopausal, None  Review of Systems:  A comprehensive 14 point ROS was performed, reviewed, and the pertinent orthopaedic findings are documented in the HPI.  Physical Exam: Vitals:  08/06/22 1121 08/06/22 1122  BP: 130/84  Weight: 82.6 kg (182 lb)  Height: 175.3 cm  (5\' 9" )  PainSc: 7 7  PainLoc: Knee Knee   General/Constitutional: The patient appears to be well-nourished, well-developed, and in no acute distress. Neuro/Psych: Normal mood and affect, oriented to person, place and time. Eyes: Non-icteric. Pupils are equal, round, and reactive to light, and exhibit synchronous movement. Lymphatic: No palpable adenopathy. Respiratory: Lungs clear to auscultation, Normal chest excursion, No wheezes, and Non-labored breathing Cardiovascular: Regular rate and rhythm. No murmurs. and No edema, swelling or tenderness, except as noted in detailed exam. Vascular: No edema, swelling or tenderness, except as noted in detailed exam. Integumentary: No impressive skin lesions present, except as noted in detailed exam. Musculoskeletal: Unremarkable, except as noted in detailed exam.  Left knee exam: GAIT: normal and uses no assistive devices. ALIGNMENT: normal SKIN: unremarkable SWELLING: minimal EFFUSION: trace WARMTH: no warmth TENDERNESS: mild tenderness over the medial joint line, minimal tenderness along lateral joint line ROM: full without pain McMURRAY'S: equivocal PATELLOFEMORAL: normal tracking with no peri-patellar tenderness and negative apprehension sign CREPITUS: Mild patellofemoral crepitance LACHMAN'S: negative PIVOT SHIFT: negative ANTERIOR DRAWER: negative POSTERIOR DRAWER: negative VARUS/VALGUS: stable  She is neurovascularly intact to the left lower extremity and foot.  Knee Imaging: AP weightbearing of both knees, as well as lateral and merchant views of the left knee are obtained. These films demonstrate moderate degenerative changes, primarily involving the patellofemoral compartment with 60% lateral patellofemoral joint space narrowing. Lesser degenerative changes of the medial and lateral compartments also are noted without significant joint space narrowing. Overall alignment is neutral. No fractures, lytic lesions, or abnormal  calcifications are noted.  Knee Imaging, external: Left knee: A recent MRI scan of the left knee is available for review and has been reviewed by myself. By report, the study demonstrates evidence of a complex tear involving the posterior horn and root of the medial meniscus, as well as degenerative fraying of the posterior portion of the lateral meniscus. There is evidence of moderate to severe degenerative changes of the lateral and patellofemoral compartments as manifest by joint space narrowing, loss of articular cartilage, and subchondral cystic changes in the anterior portion of the lateral femoral condyle. Lesser degenerative changes in the medial compartment also are noted. No ligamentous pathology is identified. Both the films and report were reviewed by myself and discussed with the patient and her husband.  Assessment:  1. Complex tear of medial meniscus of left knee as current injury.  2. Primary osteoarthritis of left knee.  3. Degenerative tear of lateral meniscus, left.   Plan: The treatment options were discussed with the patient and her husband. In addition, patient educational materials were provided regarding the diagnosis and treatment options. The patient is quite frustrated by her symptoms and functional limitations, and is ready to consider more aggressive treatment options. The patient does not wish to consider total joint replacement at this time. Therefore, I have recommended an arthroscopic procedure with debridement, partial medial and lateral meniscectomies, and reinjection of harvested infrapatellar fat cells. The procedure was discussed with the patient, as were the potential risks (including bleeding, infection, nerve and/or blood vessel injury, persistent or recurrent pain, failure of the repair, progression of arthritis, need for further surgery, blood clots, strokes, heart attacks and/or arhythmias, pneumonia, etc.) and benefits. The  patient states her understanding and  wishes to proceed. All of the patient's questions and concerns were answered. She can call any time with further concerns. She will follow up post-surgery, routine.    H&P reviewed and patient re-examined. No changes.

## 2022-08-31 NOTE — Anesthesia Postprocedure Evaluation (Addendum)
Anesthesia Post Note  Patient: Olivia Carpenter  Procedure(s) Performed: ARTHROSCOPIC DEBRIDEMENT WITH ABRASION CHONDROPLASTY, PARTIAL MEDIAL LATERAL MENISCECTOMIES, AND INJECTION OF HARVESTED INFRAPATELLAR FAT CELLS OF LEFT KNEE (Left: Knee)  Patient location during evaluation: PACU Anesthesia Type: General Level of consciousness: awake and alert Pain management: pain level controlled Vital Signs Assessment: post-procedure vital signs reviewed and stable Respiratory status: spontaneous breathing, nonlabored ventilation, respiratory function stable and patient connected to nasal cannula oxygen Cardiovascular status: blood pressure returned to baseline and stable Postop Assessment: no apparent nausea or vomiting Anesthetic complications: no   There were no known notable events for this encounter.   Last Vitals:  Vitals:   08/31/22 1330 08/31/22 1344  BP: 115/68 131/64  Pulse: 63 (!) 56  Resp: 18 18  Temp:  (!) 36.2 C  SpO2: 100% 96%    Last Pain:  Vitals:   08/31/22 1344  TempSrc: Temporal  PainSc: 1                  Corinda Gubler

## 2022-08-31 NOTE — Anesthesia Preprocedure Evaluation (Addendum)
Anesthesia Evaluation  Patient identified by MRN, date of birth, ID band Patient awake    Reviewed: Allergy & Precautions, NPO status , Patient's Chart, lab work & pertinent test results  History of Anesthesia Complications Negative for: history of anesthetic complications  Airway Mallampati: II  TM Distance: >3 FB Neck ROM: full    Dental no notable dental hx.    Pulmonary neg pulmonary ROS   Pulmonary exam normal        Cardiovascular hypertension, On Medications Normal cardiovascular exam     Neuro/Psych negative neurological ROS  negative psych ROS   GI/Hepatic negative GI ROS, Neg liver ROS,,,  Endo/Other  negative endocrine ROS    Renal/GU      Musculoskeletal  (+) Arthritis , Rheumatoid disorders,    Abdominal   Peds  Hematology negative hematology ROS (+)   Anesthesia Other Findings Past Medical History: No date: Allergic rhinitis No date: Allergy No date: Anal fissure     Comment:  takes Senna daily  No date: Arthritis No date: Constipation     Comment:  takes Senna daily - soft and regular with this No date: HTN (hypertension) 10/05/2012: Hx of dysplastic nevus     Comment:  R super pubic, mild atypia 04/11/2014: Hx of dysplastic nevus     Comment:  R ant deltoid, moderate atypia No date: Hyperlipidemia No date: Melanoma (HCC)     Comment:  L forehead and R calf 2007, Dr. Gwen Pounds and Dr. Adriana Simas at              [facility] No date: Miscarriage No date: Plantar fasciitis No date: Radial head fracture, closed     Comment:  left arm   Past Surgical History: 2000: bilateral breast augmentation 2003: BUNIONECTOMY     Comment:  Dr. Al Corpus No date: CESAREAN SECTION No date: COLONOSCOPY 1998: DILATION AND CURETTAGE OF UTERUS 08/31/2017: KNEE ARTHROSCOPY WITH MEDIAL MENISECTOMY; Right     Comment:  Procedure: KNEE ARTHROSCOPY WITH MEDIAL MENISECTOMY;                Surgeon: Donato Heinz, MD;   Location: [facility] ORS;                Service: Orthopedics;  Laterality: Right; 2000: RECTAL SURGERY     Comment:  Dr. Evette Cristal, repair of fissure No date: VAGINAL DELIVERY  BMI    Body Mass Index: 25.70 kg/m      Reproductive/Obstetrics negative OB ROS                             Anesthesia Physical Anesthesia Plan  ASA: 2  Anesthesia Plan: General LMA   Post-op Pain Management: Toradol IV (intra-op)* and Ofirmev IV (intra-op)*   Induction: Intravenous  PONV Risk Score and Plan: 3 and Dexamethasone, Ondansetron, Midazolam and Treatment may vary due to age or medical condition  Airway Management Planned: LMA  Additional Equipment:   Intra-op Plan:   Post-operative Plan: Extubation in OR  Informed Consent: I have reviewed the patients History and Physical, chart, labs and discussed the procedure including the risks, benefits and alternatives for the proposed anesthesia with the patient or authorized representative who has indicated his/her understanding and acceptance.     Dental Advisory Given  Plan Discussed with: Anesthesiologist, CRNA and Surgeon  Anesthesia Plan Comments: (Patient consented for risks of anesthesia including but not limited to:  - adverse reactions to medications - damage to eyes,  teeth, lips or other oral mucosa - nerve damage due to positioning  - sore throat or hoarseness - Damage to heart, brain, nerves, lungs, other parts of body or loss of life  Patient voiced understanding.)        Anesthesia Quick Evaluation

## 2022-09-01 ENCOUNTER — Encounter: Payer: Self-pay | Admitting: Surgery

## 2022-09-01 ENCOUNTER — Ambulatory Visit: Payer: PRIVATE HEALTH INSURANCE | Admitting: Dermatology

## 2022-09-01 DIAGNOSIS — M6752 Plica syndrome, left knee: Secondary | ICD-10-CM | POA: Insufficient documentation

## 2022-09-20 ENCOUNTER — Ambulatory Visit (INDEPENDENT_AMBULATORY_CARE_PROVIDER_SITE_OTHER): Payer: PRIVATE HEALTH INSURANCE | Admitting: Surgery

## 2022-09-20 ENCOUNTER — Telehealth: Payer: Self-pay | Admitting: Surgery

## 2022-09-20 ENCOUNTER — Encounter: Payer: Self-pay | Admitting: Surgery

## 2022-09-20 VITALS — BP 119/81 | HR 80 | Temp 98.5°F | Ht 69.0 in | Wt 171.8 lb

## 2022-09-20 DIAGNOSIS — K8020 Calculus of gallbladder without cholecystitis without obstruction: Secondary | ICD-10-CM | POA: Diagnosis not present

## 2022-09-20 NOTE — Progress Notes (Signed)
09/20/2022  Reason for Visit:  Symptomatic cholelithiasis  Requesting Provider:  Lonie Peak, PA-C  History of Present Illness: Olivia Carpenter is a 62 y.o. female presenting for evaluation of cholelithiasis.  The patient reports some episodes of RUQ abdominal pain.  She thinks she may have had them in the past as well, not knowing that they were cholelithiasis.  Her episodes are not too frequent, and her last episode was about 1-2 months ago.  Her episodes do happen after meals, and associated with nausea, and vomiting only infrequently.  Denies any current pain, nausea, or emesis.  She saw her PCP last month and U/S of RUQ was ordered, which showed cholelithiasis.  Of note, patient recently underwent left knee surgery with Dr. Joice Lofts.  She also has history of rheumatoid arthritis and is currently on Enbrel, hydroxychloroquine, and mobic.  Past Medical History: Past Medical History:  Diagnosis Date   Allergic rhinitis    Allergy    Anal fissure    takes Senna daily    Arthritis    Constipation    takes Senna daily - soft and regular with this   HTN (hypertension)    Hx of dysplastic nevus 10/05/2012   R super pubic, mild atypia   Hx of dysplastic nevus 04/11/2014   R ant deltoid, moderate atypia   Hyperlipidemia    Melanoma (HCC)    L forehead and R calf 2007, Dr. Gwen Pounds and Dr. Adriana Simas at [facility]   Miscarriage    Plantar fasciitis    Radial head fracture, closed    left arm      Past Surgical History: Past Surgical History:  Procedure Laterality Date   bilateral breast augmentation  2000   BUNIONECTOMY  2003   Dr. Al Corpus   CESAREAN SECTION     COLONOSCOPY     DILATION AND CURETTAGE OF UTERUS  1998   KNEE ARTHROSCOPY Left 08/31/2022   Procedure: ARTHROSCOPIC DEBRIDEMENT WITH ABRASION CHONDROPLASTY, PARTIAL MEDIAL LATERAL MENISCECTOMIES, AND INJECTION OF HARVESTED INFRAPATELLAR FAT CELLS OF LEFT KNEE;  Surgeon: Christena Flake, MD;  Location: [facility] ORS;  Service: Orthopedics;   Laterality: Left;   KNEE ARTHROSCOPY WITH MEDIAL MENISECTOMY Right 08/31/2017   Procedure: KNEE ARTHROSCOPY WITH MEDIAL MENISECTOMY;  Surgeon: Donato Heinz, MD;  Location: [facility] ORS;  Service: Orthopedics;  Laterality: Right;   RECTAL SURGERY  2000   Dr. Evette Cristal, repair of fissure   VAGINAL DELIVERY      Home Medications: Prior to Admission medications   Medication Sig Start Date End Date Taking? Authorizing Provider  acetaminophen (TYLENOL) 500 MG tablet Take 1,000 mg by mouth daily as needed for headache.    Yes [provider]  Cholecalciferol 25 MCG (1000 UT) capsule Take by mouth.   Yes [provider]  ENBREL SURECLICK 50 MG/ML injection Inject into the skin. Once a week 06/02/22  Yes [provider]  hydroxychloroquine (PLAQUENIL) 200 MG tablet Take 200 mg by mouth daily.   Yes [provider]  lisinopril-hydrochlorothiazide (PRINZIDE,ZESTORETIC) 10-12.5 MG tablet Take 1 tablet daily by mouth. 03/17/17  Yes Bedsole, Amy E, MD  senna-docusate (SENOKOT-S) 8.6-50 MG tablet Take 2 tablets by mouth daily.   Yes [provider]  tretinoin (RETIN-A) 0.1 % cream Apply to discoloration on the right leg once a day 04/19/22  Yes Deirdre Evener, MD    Allergies: No Known Allergies  Social History:  reports that she has never smoked. She has never used smokeless tobacco. She reports  that she does not currently use alcohol. She reports that she does not use drugs.   Family History: Family History  Problem Relation Age of Onset   Lung cancer Mother    Gallbladder disease Mother    Hypertension Mother    Cancer Mother        lung   Hypertension Father    Pulmonary embolism Father    Deep vein thrombosis Father    COPD Father    Arthritis Father    Colon cancer Brother 35       Doing well now   Diabetes Brother    Hypertension Brother    Cancer Brother        colon   Heart disease Brother    Breast cancer Maternal Aunt    Stroke  Maternal Aunt    Cancer Maternal Aunt        breast   Early death Maternal Grandfather 50   Heart disease Maternal Grandfather    Heart attack Maternal Grandfather    Breast cancer Paternal Grandmother    Colon polyps Neg Hx    Esophageal cancer Neg Hx    Rectal cancer Neg Hx    Stomach cancer Neg Hx     Review of Systems: Review of Systems  Constitutional:  Negative for chills and fever.  HENT:  Negative for hearing loss.   Respiratory:  Negative for shortness of breath.   Cardiovascular:  Negative for chest pain.  Gastrointestinal:  Positive for abdominal pain, nausea and vomiting.  Genitourinary:  Negative for dysuria.  Musculoskeletal:  Negative for myalgias.  Skin:  Negative for rash.  Neurological:  Negative for dizziness.  Psychiatric/Behavioral:  Negative for depression.     Physical Exam BP 119/81   Pulse 80   Temp 98.5 F (36.9 C) (Oral)   Ht 5\' 9"  (1.753 m)   Wt 171 lb 12.8 oz (77.9 kg)   LMP 05/13/2011   SpO2 98%   BMI 25.37 kg/m  CONSTITUTIONAL: No acute distress, well nourished. HEENT:  Normocephalic, atraumatic, extraocular motion intact. NECK: Trachea is midline, and there is no jugular venous distension.  RESPIRATORY:  Lungs are clear, and breath sounds are equal bilaterally. Normal respiratory effort without pathologic use of accessory muscles. CARDIOVASCULAR: Heart is regular without murmurs, gallops, or rubs. GI: The abdomen is soft, non-distended, currently non-tender to palpation.  Negative Murphy's sign.  MUSCULOSKELETAL:  Normal muscle strength and tone in all four extremities.  No peripheral edema or cyanosis. SKIN: Skin turgor is normal. There are no pathologic skin lesions.  NEUROLOGIC:  Motor and sensation is grossly normal.  Cranial nerves are grossly intact. PSYCH:  Alert and oriented to person, place and time. Affect is normal.  Laboratory Analysis: Labs from 08/18/22: Na 142, K 4.6, Cl 101, CO2 25, BUN 22, Cr 0.8.  Total bili 0.8, AST  159, ALT 314, Alk Phos 217.  WBC 8.3, Hgb 15.4, Hct 46.6, Plt 247.  HgA1c 6.0  Imaging: Ultrasound Abdomen on 08/25/22: IMPRESSION: Cholelithiasis without sonographic evidence of acute cholecystitis  Assessment and Plan: This is a 62 y.o. female with symptomatic cholelithiasis.  --Discussed with the patient the findings on her ultrasound and labs.  Her LFTs were elevated and could be irritation from the gallstones, or her passing a stone.  Her symptoms are not too frequent.  She just recently had surgery on her left knee and is still recovering from it.  Discussed with her the recommendation for cholecystectomy to prevent further episodes, particularly  if she's passing stones.  She's in agreement. --Discussed with her then the plan for a robotic assisted cholecystectomy, and reviewed the surgery at length with her including the planned incisions, the risks of bleeding, infection, injury to surrounding structures, that this would be an outpatient procedure, the use of ICG to better evaluate the biliary anatomy, post-operative activity restriction, and she's willing to proceed. --Patient would like to wait until after she's recovered from her surgery and there are other appointments she wants to keep.  Will schedule her for 11/23/22.  She will follow up with me in July for H&P upudate. --Discussed with her for now the need to stop her Enbrel prior to surgery.  She knows to stop 1 week before and resume 2 weeks after.  I spent 55 minutes dedicated to the care of this patient on the date of this encounter to include pre-visit review of records, face-to-face time with the patient discussing diagnosis and management, and any post-visit coordination of care.   Howie Ill, MD  [facility]

## 2022-09-20 NOTE — Telephone Encounter (Signed)
Left message for patient to call, please inform her of the following scheduled surgery with Dr. Aleen Campi.   Pre-Admission date/time, and Surgery date at [facility].  Surgery Date: 11/23/22 Preadmission Testing Date: 11/15/22 (phone 8a-1p)  Also patient will need to call at [phone], between 1-3:00pm the day before surgery, to find out what time to arrive for surgery.

## 2022-09-20 NOTE — Patient Instructions (Signed)
You have requested to have your gallbladder removed. This will be done at [facility] with Dr. Piscoya.  You will most likely be out of work 1-2 weeks for this surgery.  If you have FMLA or disability paperwork that needs filled out you may drop this off at our office or this can be faxed to [phone].  You will return after your post-op appointment with a lifting restriction for approximately 4 more weeks.  You will be able to eat anything you would like to following surgery. But, start by eating a bland diet and advance this as tolerated. The Gallbladder diet is below, please go as closely by this diet as possible prior to surgery to avoid any further attacks.  Please see the (blue)pre-care form that you have been given today. Our surgery scheduler will call you to verify surgery date and to go over information.   If you have any questions, please call our office.  Laparoscopic Cholecystectomy Laparoscopic cholecystectomy is surgery to remove the gallbladder. The gallbladder is located in the upper right part of the abdomen, behind the liver. It is a storage sac for bile, which is produced in the liver. Bile aids in the digestion and absorption of fats. Cholecystectomy is often done for inflammation of the gallbladder (cholecystitis). This condition is usually caused by a buildup of gallstones (cholelithiasis) in the gallbladder. Gallstones can block the flow of bile, and that can result in inflammation and pain. In severe cases, emergency surgery may be required. If emergency surgery is not required, you will have time to prepare for the procedure. Laparoscopic surgery is an alternative to open surgery. Laparoscopic surgery has a shorter recovery time. Your common bile duct may also need to be examined during the procedure. If stones are found in the common bile duct, they may be removed. LET YOUR HEALTH CARE PROVIDER KNOW ABOUT: Any allergies you have. All medicines you are taking,  including vitamins, herbs, eye drops, creams, and over-the-counter medicines. Previous problems you or members of your family have had with the use of anesthetics. Any blood disorders you have. Previous surgeries you have had.  Any medical conditions you have. RISKS AND COMPLICATIONS Generally, this is a safe procedure. However, problems may occur, including: Infection. Bleeding. Allergic reactions to medicines. Damage to other structures or organs. A stone remaining in the common bile duct. A bile leak from the cyst duct that is clipped when your gallbladder is removed. The need to convert to open surgery, which requires a larger incision in the abdomen. This may be necessary if your surgeon thinks that it is not safe to continue with a laparoscopic procedure. BEFORE THE PROCEDURE Ask your health care provider about: Changing or stopping your regular medicines. This is especially important if you are taking diabetes medicines or blood thinners. Taking medicines such as aspirin and ibuprofen. These medicines can thin your blood. Do not take these medicines before your procedure if your health care provider instructs you not to. Follow instructions from your health care provider about eating or drinking restrictions. Let your health care provider know if you develop a cold or an infection before surgery. Plan to have someone take you home after the procedure. Ask your health care provider how your surgical site will be marked or identified. You may be given antibiotic medicine to help prevent infection. PROCEDURE To reduce your risk of infection: Your health care team will wash or sanitize their hands. Your skin will be washed with soap. An IV   tube may be inserted into one of your veins. You will be given a medicine to make you fall asleep (general anesthetic). A breathing tube will be placed in your mouth. The surgeon will make several small cuts (incisions) in your abdomen. A thin,  lighted tube (laparoscope) that has a tiny camera on the end will be inserted through one of the small incisions. The camera on the laparoscope will send a picture to a TV screen (monitor) in the operating room. This will give the surgeon a good view inside your abdomen. A gas will be pumped into your abdomen. This will expand your abdomen to give the surgeon more room to perform the surgery. Other tools that are needed for the procedure will be inserted through the other incisions. The gallbladder will be removed through one of the incisions. After your gallbladder has been removed, the incisions will be closed with stitches (sutures), staples, or skin glue. Your incisions may be covered with a bandage (dressing). The procedure may vary among health care providers and hospitals. AFTER THE PROCEDURE Your blood pressure, heart rate, breathing rate, and blood oxygen level will be monitored often until the medicines you were given have worn off. You will be given medicines as needed to control your pain.   This information is not intended to replace advice given to you by your health care provider. Make sure you discuss any questions you have with your health care provider.   Document Released: 04/19/2005 Document Revised: 01/08/2015 Document Reviewed: 11/29/2012 Elsevier Interactive Patient Education 2016 [employer]   Low-Fat Diet for Gallbladder Conditions A low-fat diet can be helpful if you have pancreatitis or a gallbladder condition. With these conditions, your pancreas and gallbladder have trouble digesting fats. A healthy eating plan with less fat will help rest your pancreas and gallbladder and reduce your symptoms. WHAT DO I NEED TO KNOW ABOUT THIS DIET? Eat a low-fat diet. Reduce your fat intake to less than 20-30% of your total daily calories. This is less than 50-60 g of fat per day. Remember that you need some fat in your diet. Ask your dietician what your daily goal should  be. Choose nonfat and low-fat healthy foods. Look for the words "nonfat," "low fat," or "fat free." As a guide, look on the label and choose foods with less than 3 g of fat per serving. Eat only one serving. Avoid alcohol. Do not smoke. If you need help quitting, talk with your health care provider. Eat small frequent meals instead of three large heavy meals. WHAT FOODS CAN I EAT? Grains Include healthy grains and starches such as potatoes, wheat bread, fiber-rich cereal, and brown rice. Choose whole grain options whenever possible. In adults, whole grains should account for 45-65% of your daily calories.  Fruits and Vegetables Eat plenty of fruits and vegetables. Fresh fruits and vegetables add fiber to your diet. Meats and Other Protein Sources Eat lean meat such as chicken and pork. Trim any fat off of meat before cooking it. Eggs, fish, and beans are other sources of protein. In adults, these foods should account for 10-35% of your daily calories. Dairy Choose low-fat milk and dairy options. Dairy includes fat and protein, as well as calcium.  Fats and Oils Limit high-fat foods such as fried foods, sweets, baked goods, sugary drinks.  Other Creamy sauces and condiments, such as mayonnaise, can add extra fat. Think about whether or not you need to use them, or use smaller amounts or low fat options.   WHAT FOODS ARE NOT RECOMMENDED? High fat foods, such as: Baked goods. Ice cream. French toast. Sweet rolls. Pizza. Cheese bread. Foods covered with batter, butter, creamy sauces, or cheese. Fried foods. Sugary drinks and desserts. Foods that cause gas or bloating   This information is not intended to replace advice given to you by your health care provider. Make sure you discuss any questions you have with your health care provider.   Document Released: 04/24/2013 Document Reviewed: 04/24/2013 Elsevier Interactive Patient Education 2016 Elsevier Inc.   

## 2022-09-21 NOTE — Telephone Encounter (Signed)
Patient called back and surgery information reviewed.  

## 2022-10-11 ENCOUNTER — Ambulatory Visit (AMBULATORY_SURGERY_CENTER): Payer: PRIVATE HEALTH INSURANCE

## 2022-10-11 VITALS — Ht 69.0 in | Wt 175.0 lb

## 2022-10-11 DIAGNOSIS — Z800 Family history of malignant neoplasm of digestive organs: Secondary | ICD-10-CM

## 2022-10-11 MED ORDER — PEG 3350-KCL-NA BICARB-NACL 420 G PO SOLR
4000.0000 mL | Freq: Once | ORAL | 0 refills | Status: AC
Start: 2022-10-11 — End: 2022-10-11

## 2022-10-11 NOTE — Progress Notes (Signed)
No egg or soy allergy known to patient  No issues known to pt with past sedation with any surgeries or procedures Patient denies ever being told they had issues or difficulty with intubation  No FH of Malignant Hyperthermia Pt is not on diet pills Pt is not on  home 02  Pt is not on blood thinners  Pt denies issues with constipation - takes senna daily goes every couple of days reports BM as soft and regular  No A fib or A flutter Have any cardiac testing pending--no  Pt is ambulatory   PV completed. Prep instructions reviewed with patient and sent via mychart and to home address.    Patient's chart reviewed by Cathlyn Parsons CNRA prior to previsit and patient appropriate for the [facility].  Previsit completed and red dot placed by patient's name on their procedure day (on provider's schedule).

## 2022-10-26 ENCOUNTER — Encounter: Payer: Self-pay | Admitting: Gastroenterology

## 2022-11-08 ENCOUNTER — Encounter: Payer: Self-pay | Admitting: Surgery

## 2022-11-08 ENCOUNTER — Ambulatory Visit (INDEPENDENT_AMBULATORY_CARE_PROVIDER_SITE_OTHER): Payer: PRIVATE HEALTH INSURANCE | Admitting: Surgery

## 2022-11-08 VITALS — BP 136/77 | HR 73 | Temp 98.1°F | Wt 175.0 lb

## 2022-11-08 DIAGNOSIS — K8020 Calculus of gallbladder without cholecystitis without obstruction: Secondary | ICD-10-CM | POA: Diagnosis not present

## 2022-11-08 NOTE — H&P (View-Only) (Signed)
11/08/2022  History of Present Illness: Olivia Carpenter is a 62 y.o. female presenting for follow up of symptomatic cholelithiasis.  Patient is scheduled for surgery on 11/23/22.  She is recovering well from her left knee surgery.  Denies any worsening episodes of RUQ pain.  She's also having a colonoscopy this week.  Past Medical History: Past Medical History:  Diagnosis Date   Allergic rhinitis    Allergy    Anal fissure    takes Senna daily    Arthritis    Constipation    takes Senna daily - soft and regular with this   HTN (hypertension)    Hx of dysplastic nevus 10/05/2012   R super pubic, mild atypia   Hx of dysplastic nevus 04/11/2014   R ant deltoid, moderate atypia   Hyperlipidemia    Melanoma (HCC)    L forehead and R calf 2007, Dr. Gwen Pounds and Dr. Adriana Simas at [facility]   Miscarriage    Plantar fasciitis    Radial head fracture, closed    left arm      Past Surgical History: Past Surgical History:  Procedure Laterality Date   bilateral breast augmentation  2000   BUNIONECTOMY  2003   Dr. Al Corpus   CESAREAN SECTION     COLONOSCOPY     DILATION AND CURETTAGE OF UTERUS  1998   KNEE ARTHROSCOPY Left 08/31/2022   Procedure: ARTHROSCOPIC DEBRIDEMENT WITH ABRASION CHONDROPLASTY, PARTIAL MEDIAL LATERAL MENISCECTOMIES, AND INJECTION OF HARVESTED INFRAPATELLAR FAT CELLS OF LEFT KNEE;  Surgeon: Christena Flake, MD;  Location: [facility] ORS;  Service: Orthopedics;  Laterality: Left;   KNEE ARTHROSCOPY WITH MEDIAL MENISECTOMY Right 08/31/2017   Procedure: KNEE ARTHROSCOPY WITH MEDIAL MENISECTOMY;  Surgeon: Donato Heinz, MD;  Location: [facility] ORS;  Service: Orthopedics;  Laterality: Right;   RECTAL SURGERY  2000   Dr. Evette Cristal, repair of fissure   VAGINAL DELIVERY      Home Medications: Prior to Admission medications   Medication Sig Start Date End Date Taking? Authorizing Provider  acetaminophen (TYLENOL) 500 MG tablet Take 1,000 mg by mouth daily as needed for headache.    Yes [provider]  Cholecalciferol 25 MCG (1000 UT) capsule Take by mouth.   Yes [provider]  ENBREL SURECLICK 50 MG/ML injection Inject into the skin. Once a week 06/02/22  Yes [provider]  hydroxychloroquine (PLAQUENIL) 200 MG tablet Take 100 mg by mouth daily.   Yes [provider]  lisinopril-hydrochlorothiazide (PRINZIDE,ZESTORETIC) 10-12.5 MG tablet Take 1 tablet daily by mouth. 03/17/17  Yes Bedsole, Amy E, MD  meloxicam (MOBIC) 15 MG tablet Take 15 mg by mouth daily. 10/08/22  Yes [provider]  senna-docusate (SENOKOT-S) 8.6-50 MG tablet Take 2 tablets by mouth daily.   Yes [provider]    Allergies: Allergies  Allergen Reactions   Other     Grass pollen, tree/shrub pollen, Weed pollen  Reaction: Headache     Review of Systems: Review of Systems  Constitutional:  Negative for chills and fever.  HENT:  Negative for hearing loss.   Respiratory:  Negative for shortness of breath.   Cardiovascular:  Negative for chest pain.  Gastrointestinal:  Negative for abdominal pain, nausea and vomiting.    Physical Exam BP 136/77   Pulse 73   Temp 98.1 F (36.7 C) (Oral)   Wt 175 lb (79.4 kg)   LMP 05/13/2011   SpO2 97%   BMI 25.84 kg/m  CONSTITUTIONAL: No acute distress,  well nourished. HEENT:  Normocephalic, atraumatic, extraocular motion intact. RESPIRATORY:  Normal respiratory effort without pathologic use of accessory muscles. CARDIOVASCULAR: Regular rhythm and rate. GI: The abdomen is soft, non-distended, non-tender to palpation.  Negative Murphy's sign.  NEUROLOGIC:  Motor and sensation is grossly normal.  Cranial nerves are grossly intact. PSYCH:  Alert and oriented to person, place and time. Affect is normal.  Laboratory Analysis: Labs from 08/18/22: Na 142, K 4.6, Cl 101, CO2 25, BUN 22, Cr 0.8.  Total bili 0.8, AST 159, ALT 314, Alk Phos 217.  WBC 8.3, Hgb 15.4, Hct 46.6, Plt 247.  HgA1c 6.0    Imaging: Ultrasound Abdomen on 08/25/22: IMPRESSION: Cholelithiasis without sonographic evidence of acute cholecystitis  Assessment and Plan: This is a 62 y.o. female with symptomatic cholelithiasis  --Patient is scheduled for robotic assisted cholecystectomy on 11/23/22.  She's recovering well from her left knee surgery, though a bit slowly per her report.  However, she's ready for cholecystectomy and wants to keep the date as currently is.  Discussed with her again the surgical plan including the planned incisions, the risks of bleeding, infection, injury to surrounding structures, the use of ICG to better evaluate the biliary anatomy, post-operative activity restrictions, pain control.  Also discussed when to stop her Enbrel prior to surgery. --All of her questions have been answered.  I spent 20 minutes dedicated to the care of this patient on the date of this encounter to include pre-visit review of records, face-to-face time with the patient discussing diagnosis and management, and any post-visit coordination of care.   Howie Ill, MD [facility]

## 2022-11-08 NOTE — Progress Notes (Signed)
11/08/2022  History of Present Illness: Olivia Carpenter is a 62 y.o. female presenting for follow up of symptomatic cholelithiasis.  Patient is scheduled for surgery on 11/23/22.  She is recovering well from her left knee surgery.  Denies any worsening episodes of RUQ pain.  She's also having a colonoscopy this week.  Past Medical History: Past Medical History:  Diagnosis Date   Allergic rhinitis    Allergy    Anal fissure    takes Senna daily    Arthritis    Constipation    takes Senna daily - soft and regular with this   HTN (hypertension)    Hx of dysplastic nevus 10/05/2012   R super pubic, mild atypia   Hx of dysplastic nevus 04/11/2014   R ant deltoid, moderate atypia   Hyperlipidemia    Melanoma (HCC)    L forehead and R calf 2007, Dr. Gwen Pounds and Dr. Adriana Simas at [facility]   Miscarriage    Plantar fasciitis    Radial head fracture, closed    left arm      Past Surgical History: Past Surgical History:  Procedure Laterality Date   bilateral breast augmentation  2000   BUNIONECTOMY  2003   Dr. Al Corpus   CESAREAN SECTION     COLONOSCOPY     DILATION AND CURETTAGE OF UTERUS  1998   KNEE ARTHROSCOPY Left 08/31/2022   Procedure: ARTHROSCOPIC DEBRIDEMENT WITH ABRASION CHONDROPLASTY, PARTIAL MEDIAL LATERAL MENISCECTOMIES, AND INJECTION OF HARVESTED INFRAPATELLAR FAT CELLS OF LEFT KNEE;  Surgeon: Christena Flake, MD;  Location: [facility] OR[facility];  Service: Orthopedics;  Laterality: Left;   KNEE ARTHROSCOPY WITH MEDIAL MENISECTOMY Right 08/31/2017   Procedure: KNEE ARTHROSCOPY WITH MEDIAL MENISECTOMY;  Surgeon: Donato Heinz, MD;  Location: [facility] OR[facility];  Service: Orthopedics;  Laterality: Right;   RECTAL SURGERY  2000   Dr. Evette Cristal, repair of fissure   VAGINAL DELIVERY      Home Medications: Prior to Admission medications   Medication Sig Start Date End Date Taking? Authorizing Provider  acetaminophen (TYLENOL) 500 MG tablet Take 1,000 mg by mouth daily as needed for headache.    Yes [provider]  Cholecalciferol 25 MCG (1000 UT) capsule Take by mouth.   Yes [provider]  ENBREL SURECLICK 50 MG/ML injection Inject into the skin. Once a week 06/02/22  Yes [provider]  hydroxychloroquine (PLAQUENIL) 200 MG tablet Take 100 mg by mouth daily.   Yes [provider]  lisinopril-hydrochlorothiazide (PRINZIDE,ZESTORETIC) 10-12.5 MG tablet Take 1 tablet daily by mouth. 03/17/17  Yes Bedsole, Amy E, MD  meloxicam (MOBIC) 15 MG tablet Take 15 mg by mouth daily. 10/08/22  Yes [provider]  senna-docusate (SENOKOT-S) 8.6-50 MG tablet Take 2 tablets by mouth daily.   Yes [provider]    Allergies: Allergies  Allergen Reactions   Other     Grass pollen, tree/shrub pollen, Weed pollen  Reaction: Headache     Review of Systems: Review of Systems  Constitutional:  Negative for chills and fever.  HENT:  Negative for hearing loss.   Respiratory:  Negative for shortness of breath.   Cardiovascular:  Negative for chest pain.  Gastrointestinal:  Negative for abdominal pain, nausea and vomiting.    Physical Exam BP 136/77   Pulse 73   Temp 98.1 F (36.7 C) (Oral)   Wt 175 lb (79.4 kg)   LMP 05/13/2011   SpO2 97%   BMI 25.84 kg/m  CONSTITUTIONAL: No acute distress,  well nourished. HEENT:  Normocephalic, atraumatic, extraocular motion intact. RESPIRATORY:  Normal respiratory effort without pathologic use of accessory muscles. CARDIOVASCULAR: Regular rhythm and rate. GI: The abdomen is soft, non-distended, non-tender to palpation.  Negative Murphy's sign.  NEUROLOGIC:  Motor and sensation is grossly normal.  Cranial nerves are grossly intact. PSYCH:  Alert and oriented to person, place and time. Affect is normal.  Laboratory Analysis: Labs from 08/18/22: Na 142, K 4.6, Cl 101, CO2 25, BUN 22, Cr 0.8.  Total bili 0.8, AST 159, ALT 314, Alk Phos 217.  WBC 8.3, Hgb 15.4, Hct 46.6, Plt 247.  HgA1c 6.0    Imaging: Ultrasound Abdomen on 08/25/22: IMPRESSION: Cholelithiasis without sonographic evidence of acute cholecystitis  Assessment and Plan: This is a 62 y.o. female with symptomatic cholelithiasis  --Patient is scheduled for robotic assisted cholecystectomy on 11/23/22.  She's recovering well from her left knee surgery, though a bit slowly per her report.  However, she's ready for cholecystectomy and wants to keep the date as currently is.  Discussed with her again the surgical plan including the planned incisions, the risks of bleeding, infection, injury to surrounding structures, the use of ICG to better evaluate the biliary anatomy, post-operative activity restrictions, pain control.  Also discussed when to stop her Enbrel prior to surgery. --All of her questions have been answered.  I spent 20 minutes dedicated to the care of this patient on the date of this encounter to include pre-visit review of records, face-to-face time with the patient discussing diagnosis and management, and any post-visit coordination of care.   Howie Ill, MD [facility]

## 2022-11-08 NOTE — Patient Instructions (Signed)
Minimally Invasive Cholecystectomy Minimally invasive cholecystectomy is surgery to remove the gallbladder. The gallbladder is a pear-shaped organ that lies beneath the liver on the right side of the body. The gallbladder stores bile, which is a fluid that helps the body digest fats. Cholecystectomy is often done to treat inflammation (irritation and swelling) of the gallbladder (cholecystitis). This condition is usually caused by a buildup of gallstones (cholelithiasis) in the gallbladder or when the fluid in the gall bladder becomes stagnant because gallstones get stuck in the ducts (tubes) and block the flow of bile. This can result in inflammation and pain. In severe cases, emergency surgery may be required. This procedure is done through small incisions in the abdomen, instead of one large incision. It is also called laparoscopic surgery. A thin scope with a camera (laparoscope) is inserted through one incision. Then surgical instruments are inserted through the other incisions. In some cases, a minimally invasive surgery may need to be changed to a surgery that is done through a larger incision. This is called open surgery. Tell a health care provider about: Any allergies you have. All medicines you are taking, including vitamins, herbs, eye drops, creams, and over-the-counter medicines. Any problems you or family members have had with anesthetic medicines. Any bleeding problems you have. Any surgeries you have had. Any medical conditions you have. Whether you are pregnant or may be pregnant. What are the risks? Generally, this is a safe procedure. However, problems may occur, including: Infection. Bleeding. Allergic reactions to medicines. Damage to nearby structures or organs. A gallstone remaining in the common bile duct. The common bile duct carries bile from the gallbladder to the small intestine. A bile leak from the liver or cystic duct after your gallbladder is removed. What happens  before the procedure? When to stop eating and drinking Follow instructions from your health care provider about what you may eat and drink before your procedure. These may include: 8 hours before the procedure Stop eating most foods. Do not eat meat, fried foods, or fatty foods. Eat only light foods, such as toast or crackers. All liquids are okay except energy drinks and alcohol. 6 hours before the procedure Stop eating. Drink only clear liquids, such as water, clear fruit juice, black coffee, plain tea, and sports drinks. Do not drink energy drinks or alcohol. 2 hours before the procedure Stop drinking all liquids. You may be allowed to take medicines with small sips of water. If you do not follow your health care provider's instructions, your procedure may be delayed or canceled. Medicines Ask your health care provider about: Changing or stopping your regular medicines. This is especially important if you are taking diabetes medicines or blood thinners. Taking medicines such as aspirin and ibuprofen. These medicines can thin your blood. Do not take these medicines unless your health care provider tells you to take them. Taking over-the-counter medicines, vitamins, herbs, and supplements. General instructions If you will be going home right after the procedure, plan to have a responsible adult: Take you home from the hospital or clinic. You will not be allowed to drive. Care for you for the time you are told. Do not use any products that contain nicotine or tobacco for at least 4 weeks before the procedure. These products include cigarettes, chewing tobacco, and vaping devices, such as e-cigarettes. If you need help quitting, ask your health care provider. Ask your health care provider: How your surgery site will be marked. What steps will be taken to help prevent  infection. These may include: Removing hair at the surgery site. Washing skin with a germ-killing soap. Taking antibiotic  medicine. What happens during the procedure?  An IV will be inserted into one of your veins. You will be given one or both of the following: A medicine to help you relax (sedative). A medicine to make you fall asleep (general anesthetic). Your surgeon will make several small incisions in your abdomen. The laparoscope will be inserted through one of the small incisions. The camera on the laparoscope will send images to a monitor in the operating room. This lets your surgeon see inside your abdomen. A gas will be pumped into your abdomen. This will expand your abdomen to give the surgeon more room to perform the surgery. Other tools that are needed for the procedure will be inserted through the other incisions. The gallbladder will be removed through one of the incisions. Your common bile duct may be examined. If stones are found in the common bile duct, they may be removed. After your gallbladder has been removed, the incisions will be closed with stitches (sutures), staples, or skin glue. Your incisions will be covered with a bandage (dressing). The procedure may vary among health care providers and hospitals. What happens after the procedure? Your blood pressure, heart rate, breathing rate, and blood oxygen level will be monitored until you leave the hospital or clinic. You will be given medicines as needed to control your pain. You may have a drain placed in the incision. The drain will be removed a day or two after the procedure. Summary Minimally invasive cholecystectomy, also called laparoscopic cholecystectomy, is surgery to remove the gallbladder using small incisions. Tell your health care provider about all the medical conditions you have and all the medicines you are taking for those conditions. Before the procedure, follow instructions about when to stop eating and drinking and changing or stopping medicines. Plan to have a responsible adult care for you for the time you are told  after you leave the hospital or clinic. This information is not intended to replace advice given to you by your health care provider. Make sure you discuss any questions you have with your health care provider. Gallbladder Eating Plan High blood cholesterol, obesity, a sedentary lifestyle, an unhealthy diet, and diabetes are risk factors for developing gallstones. If you have a gallbladder condition, you may have trouble digesting fats and tolerating high fat intake. Eating a low-fat diet can help reduce your symptoms and may be helpful before and after having surgery to remove your gallbladder (cholecystectomy). Your health care provider may recommend that you work with a dietitian to help you reduce the amount of fat in your diet. What are tips for following this plan? General guidelines Limit your fat intake to less than 30% of your total daily calories. If you eat around 1,800 calories each day, this means eating less than 60 grams (g) of fat per day. Fat is an important part of a healthy diet. Eating a low-fat diet can make it hard to maintain a healthy body weight. Ask your dietitian how much fat, calories, and other nutrients you need each day. Eat small, frequent meals throughout the day instead of three large meals. Drink at least 8-10 cups (1.9-2.4 L) of fluid a day. Drink enough fluid to keep your urine pale yellow. If you drink alcohol: Limit how much you have to: 0-1 drink a day for women who are not pregnant. 0-2 drinks a day for men. Know how  much alcohol is in a drink. In the [place] one drink equals one 12 oz bottle of beer (355 mL), one 5 oz glass of wine (148 mL), or one 1 oz glass of hard liquor (44 mL). Reading food labels  Check nutrition facts on food labels for the amount of fat per serving. Choose foods with less than 3 grams of fat per serving. Shopping Choose nonfat and low-fat healthy foods. Look for the words "nonfat," "low-fat," or "fat-free." Avoid buying processed  or prepackaged foods. Cooking Cook using low-fat methods, such as baking, broiling, grilling, or boiling. Cook with small amounts of healthy fats, such as olive oil, grapeseed oil, canola oil, avocado oil, or sunflower oil. What foods are recommended?  All fresh, frozen, or canned fruits and vegetables. Whole grains. Low-fat or nonfat (skim) milk and yogurt. Lean meat, skinless poultry, fish, eggs, and beans. Low-fat protein supplement powders or drinks. Spices and herbs. The items listed above may not be a complete list of foods and beverages you can eat and drink. Contact a dietitian for more information. What foods are not recommended? High-fat foods. These include baked goods, fast food, fatty cuts of meat, ice cream, french toast, sweet rolls, pizza, cheese bread, foods covered with butter, creamy sauces, or cheese. Fried foods. These include french fries, tempura, battered fish, breaded chicken, fried breads, and sweets. Foods that cause bloating and gas. The items listed above may not be a complete list of foods that you should avoid. Contact a dietitian for more information. Summary A low-fat diet can be helpful if you have a gallbladder condition, or before and after gallbladder surgery. Limit your fat intake to less than 30% of your total daily calories. This is about 60 g of fat if you eat 1,800 calories each day. Eat small, frequent meals throughout the day instead of three large meals. This information is not intended to replace advice given to you by your health care provider. Make sure you discuss any questions you have with your health care provider. Document Revised: 04/03/2021 Document Reviewed: 04/03/2021 Elsevier Patient Education  2024 [employer].

## 2022-11-10 ENCOUNTER — Encounter: Payer: Self-pay | Admitting: Gastroenterology

## 2022-11-10 ENCOUNTER — Ambulatory Visit: Payer: PRIVATE HEALTH INSURANCE | Admitting: Gastroenterology

## 2022-11-10 VITALS — BP 115/62 | HR 54 | Temp 97.8°F | Resp 9 | Ht 69.0 in | Wt 175.0 lb

## 2022-11-10 DIAGNOSIS — Z800 Family history of malignant neoplasm of digestive organs: Secondary | ICD-10-CM | POA: Diagnosis not present

## 2022-11-10 DIAGNOSIS — Z1211 Encounter for screening for malignant neoplasm of colon: Secondary | ICD-10-CM | POA: Diagnosis present

## 2022-11-10 MED ORDER — SODIUM CHLORIDE 0.9 % IV SOLN
500.0000 mL | INTRAVENOUS | Status: DC
Start: 2022-11-10 — End: 2022-11-10

## 2022-11-10 NOTE — Progress Notes (Signed)
Sedate, gd SR, tolerated procedure well, VSS, report to RN 

## 2022-11-10 NOTE — Progress Notes (Signed)
Pt's states no medical or surgical changes since previsit or office visit. 

## 2022-11-10 NOTE — Op Note (Signed)
[facility] Patient Name: Olivia Carpenter Procedure Date: 11/10/2022 11:50 AM MRN: [id number] Endoscopist: Corliss Parish , MD, [id number] Age: 62 Referring MD:  Date of Birth: 10/30/1960 Gender: Female Account #: [id number] Procedure:                Colonoscopy Indications:              Screening in patient at increased risk: Colorectal                            cancer in brother before age 58, High risk colon                            cancer surveillance: Personal history of colonic                            polyps Medicines:                Monitored Anesthesia Care Procedure:                Pre-Anesthesia Assessment:                           - Prior to the procedure, a History and Physical                            was performed, and patient medications and                            allergies were reviewed. The patient's tolerance of                            previous anesthesia was also reviewed. The risks                            and benefits of the procedure and the sedation                            options and risks were discussed with the patient.                            All questions were answered, and informed consent                            was obtained. Prior Anticoagulants: The patient has                            taken no anticoagulant or antiplatelet agents. ASA                            Grade Assessment: II - A patient with mild systemic                            disease. After reviewing the risks and benefits,  the patient was deemed in satisfactory condition to                            undergo the procedure.                           After obtaining informed consent, the colonoscope                            was passed under direct vision. Throughout the                            procedure, the patient's blood pressure, pulse, and                            oxygen saturations were monitored continuously.  The                            Olympus CF-HQ190L [id number]) Colonoscope was                            introduced through the anus and advanced to the 3                            cm into the ileum. The colonoscopy was performed                            without difficulty. The patient tolerated the                            procedure. The quality of the bowel preparation was                            adequate. The terminal ileum, ileocecal valve,                            appendiceal orifice, and rectum were photographed. Scope In: 11:59:09 AM Scope Out: 12:13:10 PM Scope Withdrawal Time: 0 hours 10 minutes 19 seconds  Total Procedure Duration: 0 hours 14 minutes 1 second  Findings:                 The digital rectal exam findings include                            hemorrhoids. Pertinent negatives include no                            palpable rectal lesions.                           Multiple small-mouthed diverticula were found in                            the recto-sigmoid colon and sigmoid colon.  Normal mucosa was found in the entire colon.                           Non-bleeding non-thrombosed external and internal                            hemorrhoids were found during retroflexion, during                            perianal exam and during digital exam. The                            hemorrhoids were Grade II (internal hemorrhoids                            that prolapse but reduce spontaneously). Complications:            No immediate complications. Estimated Blood Loss:     Estimated blood loss: none. Impression:               - Hemorrhoids found on digital rectal exam.                           - Diverticulosis in the recto-sigmoid colon and in                            the sigmoid colon.                           - Normal mucosa in the entire examined colon.                           - Non-bleeding non-thrombosed external and internal                             hemorrhoids. Recommendation:           - The patient will be observed post-procedure,                            until all discharge criteria are met.                           - Discharge patient to home.                           - Patient has a contact number available for                            emergencies. The signs and symptoms of potential                            delayed complications were discussed with the                            patient. Return to normal activities tomorrow.  Written discharge instructions were provided to the                            patient.                           - High fiber diet.                           - Use FiberCon 1-2 tablets PO daily.                           - Continue present medications.                           - Repeat colonoscopy in 5 years for screening                            purposes.                           - The findings and recommendations were discussed                            with the patient.                           - The findings and recommendations were discussed                            with the designated responsible adult. Corliss Parish, MD 11/10/2022 12:19:48 PM

## 2022-11-10 NOTE — Progress Notes (Signed)
GASTROENTEROLOGY PROCEDURE H&P NOTE   Primary Care Physician: Lonie Peak, PA-C  HPI: Olivia Carpenter is a 62 y.o. female who presents for Family history of Colon Cancer.  Past Medical History:  Diagnosis Date   Allergic rhinitis    Allergy    Anal fissure    takes Senna daily    Arthritis    Constipation    takes Senna daily - soft and regular with this   HTN (hypertension)    Hx of dysplastic nevus 10/05/2012   R super pubic, mild atypia   Hx of dysplastic nevus 04/11/2014   R ant deltoid, moderate atypia   Hyperlipidemia    Melanoma (HCC)    L forehead and R calf 2007, Dr. Gwen Pounds and Dr. Adriana Simas at [facility]   Miscarriage    Plantar fasciitis    Radial head fracture, closed    left arm    Past Surgical History:  Procedure Laterality Date   bilateral breast augmentation  2000   BUNIONECTOMY  2003   Dr. Al Corpus   CESAREAN SECTION     COLONOSCOPY     DILATION AND CURETTAGE OF UTERUS  1998   KNEE ARTHROSCOPY Left 08/31/2022   Procedure: ARTHROSCOPIC DEBRIDEMENT WITH ABRASION CHONDROPLASTY, PARTIAL MEDIAL LATERAL MENISCECTOMIES, AND INJECTION OF HARVESTED INFRAPATELLAR FAT CELLS OF LEFT KNEE;  Surgeon: Christena Flake, MD;  Location: [facility] ORS;  Service: Orthopedics;  Laterality: Left;   KNEE ARTHROSCOPY WITH MEDIAL MENISECTOMY Right 08/31/2017   Procedure: KNEE ARTHROSCOPY WITH MEDIAL MENISECTOMY;  Surgeon: Donato Heinz, MD;  Location: [facility] ORS;  Service: Orthopedics;  Laterality: Right;   RECTAL SURGERY  2000   Dr. Evette Cristal, repair of fissure   VAGINAL DELIVERY     Current Outpatient Medications  Medication Sig Dispense Refill   acetaminophen (TYLENOL) 500 MG tablet Take 1,000 mg by mouth daily as needed for headache.      Cholecalciferol 25 MCG (1000 UT) capsule Take by mouth.     ENBREL SURECLICK 50 MG/ML injection Inject into the skin. Once a week     hydroxychloroquine (PLAQUENIL) 200 MG tablet Take 100 mg by mouth daily.     lisinopril-hydrochlorothiazide  (PRINZIDE,ZESTORETIC) 10-12.5 MG tablet Take 1 tablet daily by mouth. 90 tablet 1   meloxicam (MOBIC) 15 MG tablet Take 15 mg by mouth daily.     senna-docusate (SENOKOT-S) 8.6-50 MG tablet Take 2 tablets by mouth daily.     No current facility-administered medications for this visit.    Current Outpatient Medications:    acetaminophen (TYLENOL) 500 MG tablet, Take 1,000 mg by mouth daily as needed for headache. , Disp: , Rfl:    Cholecalciferol 25 MCG (1000 UT) capsule, Take by mouth., Disp: , Rfl:    ENBREL SURECLICK 50 MG/ML injection, Inject into the skin. Once a week, Disp: , Rfl:    hydroxychloroquine (PLAQUENIL) 200 MG tablet, Take 100 mg by mouth daily., Disp: , Rfl:    lisinopril-hydrochlorothiazide (PRINZIDE,ZESTORETIC) 10-12.5 MG tablet, Take 1 tablet daily by mouth., Disp: 90 tablet, Rfl: 1   meloxicam (MOBIC) 15 MG tablet, Take 15 mg by mouth daily., Disp: , Rfl:    senna-docusate (SENOKOT-S) 8.6-50 MG tablet, Take 2 tablets by mouth daily., Disp: , Rfl:  Allergies  Allergen Reactions   Other     Grass pollen, tree/shrub pollen, Weed pollen  Reaction: Headache    Family History  Problem Relation Age of Onset   Lung cancer Mother    Gallbladder disease Mother  Hypertension Mother    Cancer Mother        lung   Hypertension Father    Pulmonary embolism Father    Deep vein thrombosis Father    COPD Father    Arthritis Father    Colon cancer Brother 24       Doing well now   Diabetes Brother    Hypertension Brother    Cancer Brother        colon   Heart disease Brother    Breast cancer Maternal Aunt    Stroke Maternal Aunt    Cancer Maternal Aunt        breast   Early death Maternal Grandfather 50   Heart disease Maternal Grandfather    Heart attack Maternal Grandfather    Breast cancer Paternal Grandmother    Colon polyps Neg Hx    Esophageal cancer Neg Hx    Rectal cancer Neg Hx    Stomach cancer Neg Hx    Social History   Socioeconomic History    Marital status: Married    Spouse name: Ray   Number of children: 2   Years of education: Not on file   Highest education level: Not on file  Occupational History   Occupation: homemaker    Employer: unemployed  Tobacco Use   Smoking status: Never   Smokeless tobacco: Never  Vaping Use   Vaping Use: Never used  Substance and Sexual Activity   Alcohol use: Not Currently   Drug use: No   Sexual activity: Yes  Other Topics Concern   Not on file  Social History Narrative   Regular Exercise -  NO   Daily Caffeine Use:  3 Coke Zero   Social Determinants of Health   Financial Resource Strain: Not on file  Food Insecurity: Not on file  Transportation Needs: Not on file  Physical Activity: Not on file  Stress: Not on file  Social Connections: Not on file  Intimate Partner Violence: Not on file    Physical Exam: There were no vitals filed for this visit. There is no height or weight on file to calculate BMI. GEN: NAD EYE: Sclerae anicteric ENT: MMM CV: Non-tachycardic GI: Soft, NT/ND NEURO:  Alert & Oriented x 3  Lab Results: No results for input(s): "WBC", "HGB", "HCT", "PLT" in the last 72 hours. BMET No results for input(s): "NA", "K", "CL", "CO2", "GLUCOSE", "BUN", "CREATININE", "CALCIUM" in the last 72 hours. LFT No results for input(s): "PROT", "ALBUMIN", "AST", "ALT", "ALKPHOS", "BILITOT", "BILIDIR", "IBILI" in the last 72 hours. PT/INR No results for input(s): "LABPROT", "INR" in the last 72 hours.   Impression / Plan: This is a 62 y.o.female who presents for Family history of Colon Cancer.  The risks and benefits of endoscopic evaluation/treatment were discussed with the patient and/or family; these include but are not limited to the risk of perforation, infection, bleeding, missed lesions, lack of diagnosis, severe illness requiring hospitalization, as well as anesthesia and sedation related illnesses.  The patient's history has been reviewed, patient  examined, no change in status, and deemed stable for procedure.  The patient and/or family is agreeable to proceed.    Corliss Parish, MD [facility] Office # [phone]

## 2022-11-10 NOTE — Patient Instructions (Signed)
High fiber diet.  Use FiberCon 1-2 tablets by mouth daily.  Repeat colonoscopy in 5 years for screening purposes.  Handouts on Diverticulosis, hemorrhoids, and high fiber diet provided.  YOU HAD AN ENDOSCOPIC PROCEDURE TODAY AT THE [facility]:   Refer to the procedure report that was given to you for any specific questions about what was found during the examination.  If the procedure report does not answer your questions, please call your gastroenterologist to clarify.  If you requested that your care partner not be given the details of your procedure findings, then the procedure report has been included in a sealed envelope for you to review at your convenience later.  YOU SHOULD EXPECT: Some feelings of bloating in the abdomen. Passage of more gas than usual.  Walking can help get rid of the air that was put into your GI tract during the procedure and reduce the bloating. If you had a lower endoscopy (such as a colonoscopy or flexible sigmoidoscopy) you may notice spotting of blood in your stool or on the toilet paper. If you underwent a bowel prep for your procedure, you may not have a normal bowel movement for a few days.  Please Note:  You might notice some irritation and congestion in your nose or some drainage.  This is from the oxygen used during your procedure.  There is no need for concern and it should clear up in a day or so.  SYMPTOMS TO REPORT IMMEDIATELY:  Following lower endoscopy (colonoscopy or flexible sigmoidoscopy):  Excessive amounts of blood in the stool  Significant tenderness or worsening of abdominal pains  Swelling of the abdomen that is new, acute  Fever of 100F or higher  For urgent or emergent issues, a gastroenterologist can be reached at any hour by calling [phone]. Do not use MyChart messaging for urgent concerns.    DIET:  We do recommend a small meal at first, but then you may proceed to your regular diet.  Drink plenty of fluids but  you should avoid alcoholic beverages for 24 hours.  ACTIVITY:  You should plan to take it easy for the rest of today and you should NOT DRIVE or use heavy machinery until tomorrow (because of the sedation medicines used during the test).    FOLLOW UP: Our staff will call the number listed on your records the next business day following your procedure.  We will call around 7:15- 8:00 am to check on you and address any questions or concerns that you may have regarding the information given to you following your procedure. If we do not reach you, we will leave a message.     If any biopsies were taken you will be contacted by phone or by letter within the next 1-3 weeks.  Please call us at [phone] if you have not heard about the biopsies in 3 weeks.    SIGNATURES/CONFIDENTIALITY: You and/or your care partner have signed paperwork which will be entered into your electronic medical record.  These signatures attest to the fact that that the information above on your After Visit Summary has been reviewed and is understood.  Full responsibility of the confidentiality of this discharge information lies with you and/or your care-partner.

## 2022-11-11 ENCOUNTER — Telehealth: Payer: Self-pay | Admitting: *Deleted

## 2022-11-11 NOTE — Telephone Encounter (Signed)
  Follow up Call-     11/10/2022   10:40 AM  Call back number  Post procedure Call Back phone  # [phone]  Permission to leave phone message Yes    Post procedure follow up phone call. No answer at number given.  Left message on voicemail.

## 2022-11-15 ENCOUNTER — Encounter
Admission: RE | Admit: 2022-11-15 | Discharge: 2022-11-15 | Disposition: A | Discharge status: Home / Self Care | Payer: PRIVATE HEALTH INSURANCE | Source: Ambulatory Visit | Attending: Surgery | Admitting: Surgery

## 2022-11-15 ENCOUNTER — Other Ambulatory Visit: Payer: Self-pay

## 2022-11-15 VITALS — Ht 69.0 in | Wt 177.0 lb

## 2022-11-15 DIAGNOSIS — I10 Essential (primary) hypertension: Secondary | ICD-10-CM

## 2022-11-15 DIAGNOSIS — E7800 Pure hypercholesterolemia, unspecified: Secondary | ICD-10-CM

## 2022-11-15 NOTE — Patient Instructions (Addendum)
Your procedure is scheduled on: Tuesday 11/23/22 To find out your arrival time, please call [phone] between 1PM - 3PM on:   Monday 11/22/22 Report to the Registration Desk on the 1st floor of the [facility]. Free Valet parking is available.  If your arrival time is 6:00 am, do not arrive before that time as the [facility] entrance doors do not open until 6:00 am.  REMEMBER: Instructions that are not followed completely may result in serious medical risk, up to and including death; or upon the discretion of your surgeon and anesthesiologist your surgery may need to be rescheduled.  Do not eat food after midnight the night before surgery.  No gum chewing or hard candies.  You may however, drink CLEAR liquids up to 2 hours before you are scheduled to arrive for your surgery. Do not drink anything within 2 hours of your scheduled arrival time.  Clear liquids include: - water  - apple juice without pulp - gatorade (not RED colors) - black coffee or tea (Do NOT add milk or creamers to the coffee or tea) Do NOT drink anything that is not on this list.  Type 1 and Type 2 diabetics should only drink water.  IOne week prior to surgery: Stop Anti-inflammatories (NSAIDS) such as Advil, Aleve, Ibuprofen, Motrin, Naproxen, Naprosyn and Aspirin based products such as Excedrin, Goody's Powder, BC Powder. You may however, continue to take Tylenol if needed for pain up until the day of surgery.  Stop ANY OVER THE COUNTER supplements and vitamins until after surgery.  Continue taking all prescription medications with the exception of the following: Enbrel, hold until after surgery.  TAKE ONLY THESE MEDICATIONS THE MORNING OF SURGERY WITH A SIP OF WATER:  none  No Alcohol for 24 hours before or after surgery.  No Smoking including e-cigarettes for 24 hours before surgery.  No chewable tobacco products for at least 6 hours before surgery.  No nicotine patches on the day of surgery.  Do  not use any "recreational" drugs for at least a week (preferably 2 weeks) before your surgery.  Please be advised that the combination of cocaine and anesthesia may have negative outcomes, up to and including death. If you test positive for cocaine, your surgery will be cancelled.  On the morning of surgery brush your teeth with toothpaste and water, you may rinse your mouth with mouthwash if you wish. Do not swallow any toothpaste or mouthwash.  Use CHG Soap or wipes as directed on instruction sheet.  Do not wear lotions, powders, or perfumes.   Do not shave body hair from the neck down 48 hours before surgery.  Wear comfortable clothing (specific to your surgery type) to the hospital.  Do not wear jewelry, make-up, hairpins, clips or nail polish.  Contact lenses, hearing aids and dentures may not be worn into surgery.  Do not bring valuables to the hospital. [facility] is not responsible for any missing/lost belongings or valuables.   Notify your doctor if there is any change in your medical condition (cold, fever, infection).  If you are being discharged the day of surgery, you will not be allowed to drive home. You will need a responsible individual to drive you home and stay with you for 24 hours after surgery.   If you are taking public transportation, you will need to have a responsible individual with you.  If you are being admitted to the hospital overnight, leave your suitcase in the car. After surgery it  may be brought to your room.  In case of increased patient census, it may be necessary for you, the patient, to continue your postoperative care in the Same Day Surgery department.  After surgery, you can help prevent lung complications by doing breathing exercises.  Take deep breaths and cough every 1-2 hours. Your doctor may order a device called an Incentive Spirometer to help you take deep breaths. When coughing or sneezing, hold a pillow firmly against your incision  with both hands. This is called "splinting." Doing this helps protect your incision. It also decreases belly discomfort.  Surgery Visitation Policy:  Patients undergoing a surgery or procedure may have two family members or support persons with them as long as the person is not COVID-19 positive or experiencing its symptoms.   Inpatient Visitation:    Visiting hours are 7 a.m. to 8 p.m. Up to four visitors are allowed at one time in a patient room. The visitors may rotate out with other people during the day. One designated support person (adult) may remain overnight.  Please call the Pre-admissions Testing Dept. at [phone] if you have any questions about these instructions.     Preparing for Surgery with CHLORHEXIDINE GLUCONATE (CHG) Soap  Chlorhexidine Gluconate (CHG) Soap  o An antiseptic cleaner that kills germs and bonds with the skin to continue killing germs even after washing  o Used for showering the night before surgery and morning of surgery  Before surgery, you can play an important role by reducing the number of germs on your skin.  CHG (Chlorhexidine gluconate) soap is an antiseptic cleanser which kills germs and bonds with the skin to continue killing germs even after washing.  Please do not use if you have an allergy to CHG or antibacterial soaps. If your skin becomes reddened/irritated stop using the CHG.  1. Shower the NIGHT BEFORE SURGERY and the MORNING OF SURGERY with CHG soap.  2. If you choose to wash your hair, wash your hair first as usual with your normal shampoo.  3. After shampooing, rinse your hair and body thoroughly to remove the shampoo.  4. Use CHG as you would any other liquid soap. You can apply CHG directly to the skin and wash gently with a scrungie or a clean washcloth.  5. Apply the CHG soap to your body only from the neck down. Do not use on open wounds or open sores. Avoid contact with your eyes, ears, mouth, and genitals (private  parts). Wash face and genitals (private parts) with your normal soap.  6. Wash thoroughly, paying special attention to the area where your surgery will be performed.  7. Thoroughly rinse your body with warm water.  8. Do not shower/wash with your normal soap after using and rinsing off the CHG soap.  9. Pat yourself dry with a clean towel.  10. Wear clean pajamas to bed the night before surgery.  12. Place clean sheets on your bed the night of your first shower and do not sleep with pets.  13. Shower again with the CHG soap on the day of surgery prior to arriving at the hospital.  14. Do not apply any deodorants/lotions/powders.  15. Please wear clean clothes to the hospital.

## 2022-11-16 ENCOUNTER — Encounter: Payer: Self-pay | Admitting: Urgent Care

## 2022-11-16 ENCOUNTER — Encounter
Admission: RE | Admit: 2022-11-16 | Discharge: 2022-11-16 | Disposition: A | Discharge status: Home / Self Care | Payer: PRIVATE HEALTH INSURANCE | Source: Ambulatory Visit | Attending: Surgery | Admitting: Surgery

## 2022-11-16 DIAGNOSIS — Z01812 Encounter for preprocedural laboratory examination: Secondary | ICD-10-CM | POA: Diagnosis not present

## 2022-11-16 DIAGNOSIS — E7800 Pure hypercholesterolemia, unspecified: Secondary | ICD-10-CM | POA: Diagnosis not present

## 2022-11-16 DIAGNOSIS — I10 Essential (primary) hypertension: Secondary | ICD-10-CM | POA: Insufficient documentation

## 2022-11-16 LAB — CBC
HCT: 45 % (ref 36.0–46.0)
Hemoglobin: 14.4 g/dL (ref 12.0–15.0)
MCH: 29.5 pg (ref 26.0–34.0)
MCHC: 32 g/dL (ref 30.0–36.0)
MCV: 92.2 fL (ref 80.0–100.0)
Platelets: 267 10*3/uL (ref 150–400)
RBC: 4.88 MIL/uL (ref 3.87–5.11)
RDW: 12.7 % (ref 11.5–15.5)
WBC: 7 10*3/uL (ref 4.0–10.5)
nRBC: 0 % (ref 0.0–0.2)

## 2022-11-16 LAB — BASIC METABOLIC PANEL
Anion gap: 10 (ref 5–15)
BUN: 20 mg/dL (ref 8–23)
CO2: 26 mmol/L (ref 22–32)
Calcium: 9.9 mg/dL (ref 8.9–10.3)
Chloride: 105 mmol/L (ref 98–111)
Creatinine, Ser: 0.82 mg/dL (ref 0.44–1.00)
GFR, Estimated: >60 mL/min (ref >60)
Glucose, Bld: 100 mg/dL — ABNORMAL HIGH (ref 70–99)
Potassium: 4 mmol/L (ref 3.5–5.1)
Sodium: 141 mmol/L (ref 135–145)

## 2022-11-23 ENCOUNTER — Other Ambulatory Visit: Payer: Self-pay

## 2022-11-23 ENCOUNTER — Encounter: Payer: Self-pay | Admitting: Surgery

## 2022-11-23 ENCOUNTER — Ambulatory Visit: Payer: PRIVATE HEALTH INSURANCE | Admitting: Certified Registered"

## 2022-11-23 ENCOUNTER — Ambulatory Visit: Payer: Self-pay | Admitting: Urgent Care

## 2022-11-23 ENCOUNTER — Ambulatory Visit
Admission: RE | Admit: 2022-11-23 | Discharge: 2022-11-23 | Disposition: A | Discharge status: Home / Self Care | Payer: PRIVATE HEALTH INSURANCE | Source: Ambulatory Visit | Attending: Surgery | Admitting: Surgery

## 2022-11-23 ENCOUNTER — Encounter
Admission: RE | Disposition: A | Discharge status: Home / Self Care | Payer: Self-pay | Source: Ambulatory Visit | Attending: Surgery

## 2022-11-23 DIAGNOSIS — K8010 Calculus of gallbladder with chronic cholecystitis without obstruction: Secondary | ICD-10-CM | POA: Insufficient documentation

## 2022-11-23 DIAGNOSIS — K8020 Calculus of gallbladder without cholecystitis without obstruction: Secondary | ICD-10-CM

## 2022-11-23 SURGERY — CHOLECYSTECTOMY, ROBOT-ASSISTED, LAPAROSCOPIC
Anesthesia: General | Site: Abdomen

## 2022-11-23 MED ORDER — EPHEDRINE SULFATE (PRESSORS) 50 MG/ML IJ SOLN
INTRAMUSCULAR | Status: DC | PRN
  Administered 2022-11-23 (×2): 10 mg via INTRAVENOUS

## 2022-11-23 MED ORDER — CHLORHEXIDINE GLUCONATE 0.12 % MT SOLN
15.0000 mL | Freq: Once | OROMUCOSAL | Status: AC
  Administered 2022-11-23: 15 mL via OROMUCOSAL

## 2022-11-23 MED ORDER — OXYCODONE HCL 5 MG/5ML PO SOLN: 5.0000 mg | Freq: Once | ORAL | Status: AC | PRN

## 2022-11-23 MED ORDER — PROPOFOL 10 MG/ML IV BOLUS
INTRAVENOUS | Status: AC
  Filled 2022-11-23: qty 20

## 2022-11-23 MED ORDER — SUGAMMADEX SODIUM 200 MG/2ML IV SOLN
INTRAVENOUS | Status: DC | PRN
  Administered 2022-11-23: 200 mg via INTRAVENOUS

## 2022-11-23 MED ORDER — SEVOFLURANE IN SOLN
RESPIRATORY_TRACT | Status: AC
  Filled 2022-11-23: qty 250

## 2022-11-23 MED ORDER — DEXAMETHASONE SODIUM PHOSPHATE 10 MG/ML IJ SOLN
INTRAMUSCULAR | Status: DC | PRN
  Administered 2022-11-23: 10 mg via INTRAVENOUS

## 2022-11-23 MED ORDER — IBUPROFEN 600 MG PO TABS: 600.0000 mg | ORAL_TABLET | Freq: Three times a day (TID) | ORAL | 1 refills | Status: AC | PRN

## 2022-11-23 MED ORDER — CHLORHEXIDINE GLUCONATE 0.12 % MT SOLN
OROMUCOSAL | Status: AC
  Filled 2022-11-23: qty 15

## 2022-11-23 MED ORDER — ONDANSETRON HCL 4 MG/2ML IJ SOLN
INTRAMUSCULAR | Status: DC | PRN
  Administered 2022-11-23: 4 mg via INTRAVENOUS

## 2022-11-23 MED ORDER — FAMOTIDINE 20 MG PO TABS
ORAL_TABLET | ORAL | Status: AC
  Filled 2022-11-23: qty 1

## 2022-11-23 MED ORDER — KETOROLAC TROMETHAMINE 30 MG/ML IJ SOLN
INTRAMUSCULAR | Status: DC | PRN
  Administered 2022-11-23: 30 mg via INTRAVENOUS

## 2022-11-23 MED ORDER — ACETAMINOPHEN 500 MG PO TABS
1000.0000 mg | ORAL_TABLET | ORAL | Status: AC
  Administered 2022-11-23: 1000 mg via ORAL

## 2022-11-23 MED ORDER — CEFAZOLIN SODIUM-DEXTROSE 2-4 GM/100ML-% IV SOLN
INTRAVENOUS | Status: AC
  Filled 2022-11-23: qty 100

## 2022-11-23 MED ORDER — MIDAZOLAM HCL 2 MG/2ML IJ SOLN
INTRAMUSCULAR | Status: DC | PRN
  Administered 2022-11-23: 2 mg via INTRAVENOUS

## 2022-11-23 MED ORDER — GABAPENTIN 300 MG PO CAPS
ORAL_CAPSULE | ORAL | Status: AC
  Filled 2022-11-23: qty 1

## 2022-11-23 MED ORDER — KETAMINE HCL 50 MG/5ML IJ SOSY
PREFILLED_SYRINGE | INTRAMUSCULAR | Status: AC
  Filled 2022-11-23: qty 5

## 2022-11-23 MED ORDER — GABAPENTIN 300 MG PO CAPS
300.0000 mg | ORAL_CAPSULE | ORAL | Status: AC
  Administered 2022-11-23: 300 mg via ORAL

## 2022-11-23 MED ORDER — OXYCODONE HCL 5 MG PO TABS: 5.0000 mg | ORAL_TABLET | ORAL | 0 refills | Status: DC | PRN

## 2022-11-23 MED ORDER — PROPOFOL 10 MG/ML IV BOLUS
INTRAVENOUS | Status: DC | PRN
Start: 2022-11-23 — End: 2022-11-23
  Administered 2022-11-23: 150 mg via INTRAVENOUS

## 2022-11-23 MED ORDER — OXYCODONE HCL 5 MG PO TABS
5.0000 mg | ORAL_TABLET | Freq: Once | ORAL | Status: AC | PRN
  Administered 2022-11-23: 5 mg via ORAL

## 2022-11-23 MED ORDER — ORAL CARE MOUTH RINSE: 15.0000 mL | Freq: Once | OROMUCOSAL | Status: AC

## 2022-11-23 MED ORDER — OXYCODONE HCL 5 MG PO TABS
ORAL_TABLET | ORAL | Status: AC
  Filled 2022-11-23: qty 1

## 2022-11-23 MED ORDER — BUPIVACAINE-EPINEPHRINE (PF) 0.25% -1:200000 IJ SOLN
INTRAMUSCULAR | Status: AC
  Filled 2022-11-23: qty 30

## 2022-11-23 MED ORDER — CEFAZOLIN SODIUM-DEXTROSE 2-4 GM/100ML-% IV SOLN
2.0000 g | INTRAVENOUS | Status: AC
  Administered 2022-11-23: 2 g via INTRAVENOUS

## 2022-11-23 MED ORDER — ACETAMINOPHEN 500 MG PO TABS
ORAL_TABLET | ORAL | Status: AC
  Filled 2022-11-23: qty 2

## 2022-11-23 MED ORDER — PHENYLEPHRINE 80 MCG/ML (10ML) SYRINGE FOR IV PUSH (FOR BLOOD PRESSURE SUPPORT)
PREFILLED_SYRINGE | INTRAVENOUS | Status: DC | PRN
  Administered 2022-11-23: 40 ug via INTRAVENOUS

## 2022-11-23 MED ORDER — FAMOTIDINE 20 MG PO TABS
20.0000 mg | ORAL_TABLET | Freq: Once | ORAL | Status: AC
  Administered 2022-11-23: 20 mg via ORAL

## 2022-11-23 MED ORDER — MIDAZOLAM HCL 2 MG/2ML IJ SOLN
INTRAMUSCULAR | Status: AC
  Filled 2022-11-23: qty 2

## 2022-11-23 MED ORDER — ROCURONIUM BROMIDE 100 MG/10ML IV SOLN
INTRAVENOUS | Status: DC | PRN
  Administered 2022-11-23: 50 mg via INTRAVENOUS
  Administered 2022-11-23: 10 mg via INTRAVENOUS

## 2022-11-23 MED ORDER — INDOCYANINE GREEN 25 MG IV SOLR
INTRAVENOUS | Status: AC
  Filled 2022-11-23: qty 10

## 2022-11-23 MED ORDER — HYDROMORPHONE HCL 1 MG/ML IJ SOLN: 0.2500 mg | INTRAMUSCULAR | Status: DC | PRN

## 2022-11-23 MED ORDER — INDOCYANINE GREEN 25 MG IV SOLR
2.5000 mg | INTRAVENOUS | Status: AC
  Administered 2022-11-23: 2.5 mg via INTRAVENOUS

## 2022-11-23 MED ORDER — ACETAMINOPHEN 500 MG PO TABS: 1000.0000 mg | ORAL_TABLET | Freq: Four times a day (QID) | ORAL | Status: AC | PRN

## 2022-11-23 MED ORDER — CHLORHEXIDINE GLUCONATE CLOTH 2 % EX PADS: 6.0000 | MEDICATED_PAD | Freq: Once | CUTANEOUS | Status: DC

## 2022-11-23 MED ORDER — FENTANYL CITRATE (PF) 100 MCG/2ML IJ SOLN
INTRAMUSCULAR | Status: AC
  Filled 2022-11-23: qty 2

## 2022-11-23 MED ORDER — BUPIVACAINE LIPOSOME 1.3 % IJ SUSP
INTRAMUSCULAR | Status: AC
  Filled 2022-11-23: qty 10

## 2022-11-23 MED ORDER — LIDOCAINE HCL (CARDIAC) PF 100 MG/5ML IV SOSY
PREFILLED_SYRINGE | INTRAVENOUS | Status: DC | PRN
  Administered 2022-11-23: 100 mg via INTRAVENOUS

## 2022-11-23 MED ORDER — FENTANYL CITRATE (PF) 100 MCG/2ML IJ SOLN
INTRAMUSCULAR | Status: DC | PRN
  Administered 2022-11-23 (×2): 50 ug via INTRAVENOUS

## 2022-11-23 MED ORDER — LACTATED RINGERS IV SOLN: INTRAVENOUS | Status: DC

## 2022-11-23 MED ORDER — BUPIVACAINE-EPINEPHRINE (PF) 0.25% -1:200000 IJ SOLN
INTRAMUSCULAR | Status: DC | PRN
  Administered 2022-11-23: 40 mL

## 2022-11-23 MED ORDER — KETAMINE HCL 10 MG/ML IJ SOLN
INTRAMUSCULAR | Status: DC | PRN
  Administered 2022-11-23: 30 mg via INTRAVENOUS
  Administered 2022-11-23: 10 mg via INTRAVENOUS

## 2022-11-23 MED ORDER — GLYCOPYRROLATE 0.2 MG/ML IJ SOLN
INTRAMUSCULAR | Status: DC | PRN
  Administered 2022-11-23: .2 mg via INTRAVENOUS

## 2022-11-23 SURGICAL SUPPLY — 36 items
ADH SKN CLS APL DERMABOND .7 (GAUZE/BANDAGES/DRESSINGS) ×1
CAUTERY HOOK MNPLR 1.6 DVNC XI (INSTRUMENTS) ×1 IMPLANT
CLIP LIGATING HEMO O LOK GREEN (MISCELLANEOUS) ×1 IMPLANT
DERMABOND ADVANCED .7 DNX12 (GAUZE/BANDAGES/DRESSINGS) ×1 IMPLANT
DRAPE ARM DVNC X/XI (DISPOSABLE) ×4 IMPLANT
DRAPE COLUMN DVNC XI (DISPOSABLE) ×1 IMPLANT
ELECT CAUTERY BLADE TIP 2.5 (TIP) ×1
ELECT REM PT RETURN 9FT ADLT (ELECTROSURGICAL) ×1
ELECTRODE CAUTERY BLDE TIP 2.5 (TIP) ×1 IMPLANT
ELECTRODE REM PT RTRN 9FT ADLT (ELECTROSURGICAL) ×1 IMPLANT
FORCEPS BPLR R/ABLATION 8 DVNC (INSTRUMENTS) ×1 IMPLANT
FORCEPS PROGRASP DVNC XI (FORCEP) ×1 IMPLANT
GLOVE SURG SYN 7.0 (GLOVE) ×3
GLOVE SURG SYN 7.0 PF PI (GLOVE) ×2 IMPLANT
GLOVE SURG SYN 7.5 E (GLOVE) ×3
GLOVE SURG SYN 7.5 PF PI (GLOVE) ×2 IMPLANT
GOWN STRL REUS W/ TWL LRG LVL3 (GOWN DISPOSABLE) ×4 IMPLANT
GOWN STRL REUS W/TWL LRG LVL3 (GOWN DISPOSABLE) ×6
KIT PINK PAD W/HEAD ARE REST (MISCELLANEOUS) ×1
KIT PINK PAD W/HEAD ARM REST (MISCELLANEOUS) ×1 IMPLANT
LABEL OR SOLS (LABEL) ×1 IMPLANT
NDL HYPO 22X1.5 SAFETY MO (MISCELLANEOUS) ×1 IMPLANT
NEEDLE HYPO 22X1.5 SAFETY MO (MISCELLANEOUS) ×1
NS IRRIG 500ML POUR BTL (IV SOLUTION) ×1 IMPLANT
OBTURATOR OPTICAL STND 8 DVNC (TROCAR) ×1
OBTURATOR OPTICALSTD 8 DVNC (TROCAR) ×1 IMPLANT
PACK LAP CHOLECYSTECTOMY (MISCELLANEOUS) ×1 IMPLANT
PENCIL SMOKE EVACUATOR (MISCELLANEOUS) ×1 IMPLANT
SEAL UNIV 5-12 XI (MISCELLANEOUS) ×4 IMPLANT
SET TUBE SMOKE EVAC HIGH FLOW (TUBING) ×1 IMPLANT
SOL ELECTROSURG ANTI STICK (MISCELLANEOUS) ×1
SOLUTION ELECTROSURG ANTI STCK (MISCELLANEOUS) ×1 IMPLANT
SUT MNCRL AB 4-0 PS2 18 (SUTURE) ×1 IMPLANT
SUT VICRYL 0 UR6 27IN ABS (SUTURE) ×2 IMPLANT
SYS BAG RETRIEVAL 10MM (BASKET) ×1
SYSTEM BAG RETRIEVAL 10MM (BASKET) ×1 IMPLANT

## 2022-11-23 NOTE — Anesthesia Postprocedure Evaluation (Signed)
Anesthesia Post Note  Patient: Olivia Carpenter  Procedure(s) Performed: XI ROBOTIC ASSISTED LAPAROSCOPIC CHOLECYSTECTOMY (Abdomen) INDOCYANINE GREEN FLUORESCENCE IMAGING (ICG) (Abdomen)  Patient location during evaluation: PACU Anesthesia Type: General Level of consciousness: awake and alert Pain management: pain level controlled Vital Signs Assessment: post-procedure vital signs reviewed and stable Respiratory status: spontaneous breathing, nonlabored ventilation, respiratory function stable and patient connected to nasal cannula oxygen Cardiovascular status: blood pressure returned to baseline and stable Postop Assessment: no apparent nausea or vomiting Anesthetic complications: no   No notable events documented.   Last Vitals:  Vitals:   11/23/22 0930 11/23/22 0945  BP: (!) 120/59 (!) 120/57  Pulse: 74 74  Resp: 17 15  Temp:    SpO2: 95% 96%    Last Pain:  Vitals:   11/23/22 0951  TempSrc:   PainSc: 2                  Louie Boston

## 2022-11-23 NOTE — Op Note (Signed)
  Procedure Date:  11/23/2022  Pre-operative Diagnosis:  Symptomatic cholelithiasis  Post-operative Diagnosis: Symptomatic cholelithiasis  Procedure:  Robotic assisted cholecystectomy with ICG FireFly cholangiogram  Surgeon:  Howie Ill, MD  Assistant:  Rosezena Sensor, PA-S  Anesthesia:  General endotracheal  Estimated Blood Loss:  10 ml  Specimens:  gallbladder  Complications:  None  Indications for Procedure:  This is a 62 y.o. female who presents with abdominal pain and workup revealing symptomatic cholelithiasis.  The benefits, complications, treatment options, and expected outcomes were discussed with the patient. The risks of bleeding, infection, recurrence of symptoms, failure to resolve symptoms, bile duct damage, bile duct leak, retained common bile duct stone, bowel injury, and need for further procedures were all discussed with the patient and she was willing to proceed.  Description of Procedure: The patient was correctly identified in the preoperative area and brought into the operating room.  The patient was placed supine with VTE prophylaxis in place.  Appropriate time-outs were performed.  Anesthesia was induced and the patient was intubated.  Appropriate antibiotics were infused.  The abdomen was prepped and draped in a sterile fashion. An infraumbilical incision was made. A cutdown technique was used to enter the abdominal cavity without injury, and a 12 mm robotic port was inserted.  Pneumoperitoneum was obtained with appropriate opening pressures.  Three 8-mm ports were placed in the mid abdomen at the level of the umbilicus under direct visualization.  The DaVinci platform was docked, camera targeted, and instruments were placed under direct visualization.  The gallbladder was identified.  The fundus was grasped and retracted cephalad.  Adhesions were lysed bluntly and with electrocautery. The infundibulum was grasped and retracted laterally, exposing the  peritoneum overlying the gallbladder.  This was incised with electrocautery and extended on either side of the gallbladder.  FireFly cholangiogram was then obtained, and we were able to clearly identify the cystic duct and common bile duct.  The cystic duct and cystic artery were carefully dissected with combination of cautery and blunt dissection.  Both were clipped twice proximally and once distally, cutting in between.  The gallbladder was taken from the gallbladder fossa in a retrograde fashion with electrocautery. The gallbladder was placed in an Endocatch bag. The liver bed was inspected and any bleeding was controlled with electrocautery. The right upper quadrant was then inspected again revealing intact clips, no bleeding, and no ductal injury.  The 8 mm ports were removed under direct visualization and the 12 mm port was removed.  The Endocatch bag was brought out via the umbilical incision. The fascial opening was closed using 0 vicryl suture.  Local anesthetic was infused in all incisions and the incisions were closed with 4-0 Monocryl.  The wounds were cleaned and sealed with DermaBond.  The patient was emerged from anesthesia and extubated and brought to the recovery room for further management.  The patient tolerated the procedure well and all counts were correct at the end of the case.   Howie Ill, MD

## 2022-11-23 NOTE — Discharge Instructions (Addendum)
Discharge Instructions: 1.  Patient may shower, but do not scrub wounds heavily and dab dry only. 2.  Do not submerge wounds in pool/tub until fully healed. 3.  Do not apply ointments or hydrogen peroxide to the wounds. 4.  May apply ice packs to the wounds for comfort. 5.  May resume your Enbrel in 1 week. 6.  If you need to take Ibuprofen, do not take Mobic at the same time. 7.  Do not drive while taking narcotics for pain control.  Prior to driving, make sure you are able to rotate right and left to look at blindspots without significant pain or discomfort. 8.  No heavy lifting or pushing of more than 10-15 lbs for 6 weeks.   AMBULATORY SURGERY  DISCHARGE INSTRUCTIONS   The drugs that you were given will stay in your system until tomorrow so for the next 24 hours you should not:  Drive an automobile Make any legal decisions Drink any alcoholic beverage   You may resume regular meals tomorrow.  Today it is better to start with liquids and gradually work up to solid foods.  You may eat anything you prefer, but it is better to start with liquids, then soup and crackers, and gradually work up to solid foods.   Please notify your doctor immediately if you have any unusual bleeding, trouble breathing, redness and pain at the surgery site, drainage, fever, or pain not relieved by medication.    Additional Instructions:   Please contact your physician with any problems or Same Day Surgery at [phone], Monday through Friday 6 am to 4 pm, or [place] at [facility] number at [phone].

## 2022-11-23 NOTE — Anesthesia Preprocedure Evaluation (Signed)
Anesthesia Evaluation  Patient identified by MRN, date of birth, ID band Patient awake    Reviewed: Allergy & Precautions, NPO status , Patient's Chart, lab work & pertinent test results  History of Anesthesia Complications Negative for: history of anesthetic complications  Airway Mallampati: II  TM Distance: >3 FB Neck ROM: full    Dental no notable dental hx.    Pulmonary neg pulmonary ROS   Pulmonary exam normal        Cardiovascular hypertension, On Medications negative cardio ROS Normal cardiovascular exam     Neuro/Psych negative neurological ROS  negative psych ROS   GI/Hepatic negative GI ROS, Neg liver ROS,,,  Endo/Other  negative endocrine ROS    Renal/GU      Musculoskeletal  (+) Arthritis , Rheumatoid disorders,    Abdominal   Peds  Hematology negative hematology ROS (+)   Anesthesia Other Findings Past Medical History: No date: Allergic rhinitis No date: Allergy No date: Anal fissure     Comment:  takes Senna daily  No date: Arthritis No date: Constipation     Comment:  takes Senna daily - soft and regular with this No date: HTN (hypertension) 10/05/2012: Hx of dysplastic nevus     Comment:  R super pubic, mild atypia 04/11/2014: Hx of dysplastic nevus     Comment:  R ant deltoid, moderate atypia No date: Hyperlipidemia No date: Melanoma (HCC)     Comment:  L forehead and R calf 2007, Dr. Gwen Pounds and Dr. Adriana Simas at              [facility] No date: Miscarriage No date: Plantar fasciitis No date: Radial head fracture, closed     Comment:  left arm   Past Surgical History: 2000: bilateral breast augmentation 2003: BUNIONECTOMY     Comment:  Dr. Al Corpus No date: CESAREAN SECTION No date: COLONOSCOPY 1998: DILATION AND CURETTAGE OF UTERUS 08/31/2022: KNEE ARTHROSCOPY; Left     Comment:  Procedure: ARTHROSCOPIC DEBRIDEMENT WITH ABRASION               CHONDROPLASTY, PARTIAL MEDIAL LATERAL  MENISCECTOMIES, AND              INJECTION OF HARVESTED INFRAPATELLAR FAT CELLS OF LEFT               KNEE;  Surgeon: Christena Flake, MD;  Location: [facility] ORS;                Service: Orthopedics;  Laterality: Left; 08/31/2017: KNEE ARTHROSCOPY WITH MEDIAL MENISECTOMY; Right     Comment:  Procedure: KNEE ARTHROSCOPY WITH MEDIAL MENISECTOMY;                Surgeon: Donato Heinz, MD;  Location: [facility] ORS;                Service: Orthopedics;  Laterality: Right; 2000: RECTAL SURGERY     Comment:  Dr. Evette Cristal, repair of fissure No date: VAGINAL DELIVERY  BMI    Body Mass Index: 26.14 kg/m      Reproductive/Obstetrics negative OB ROS                             Anesthesia Physical Anesthesia Plan  ASA: 2  Anesthesia Plan: General ETT   Post-op Pain Management: Tylenol PO (pre-op)*, Gabapentin PO (pre-op)*, Toradol IV (intra-op)* and Ketamine IV*   Induction: Intravenous  PONV Risk Score and Plan: 4 or greater and Ondansetron,  Dexamethasone, Midazolam and Treatment may vary due to age or medical condition  Airway Management Planned: Oral ETT  Additional Equipment:   Intra-op Plan:   Post-operative Plan: Extubation in OR  Informed Consent: I have reviewed the patients History and Physical, chart, labs and discussed the procedure including the risks, benefits and alternatives for the proposed anesthesia with the patient or authorized representative who has indicated his/her understanding and acceptance.     Dental Advisory Given  Plan Discussed with: Anesthesiologist, CRNA and Surgeon  Anesthesia Plan Comments: (Patient consented for risks of anesthesia including but not limited to:  - adverse reactions to medications - damage to eyes, teeth, lips or other oral mucosa - nerve damage due to positioning  - sore throat or hoarseness - Damage to heart, brain, nerves, lungs, other parts of body or loss of life  Patient voiced understanding.)        Anesthesia Quick Evaluation

## 2022-11-23 NOTE — Anesthesia Procedure Notes (Signed)
Procedure Name: Intubation Date/Time: 11/23/2022 7:40 AM  Performed by: Cheral Bay, CRNAPre-anesthesia Checklist: Patient identified, Emergency Drugs available, Suction available and Patient being monitored Patient Re-evaluated:Patient Re-evaluated prior to induction Oxygen Delivery Method: Circle system utilized Preoxygenation: Pre-oxygenation with 100% oxygen Induction Type: IV induction Ventilation: Mask ventilation without difficulty Laryngoscope Size: McGraph and 3 Grade View: Grade I Tube type: Oral Number of attempts: 1 Airway Equipment and Method: Stylet Placement Confirmation: ETT inserted through vocal cords under direct vision, positive ETCO2 and breath sounds checked- equal and bilateral Secured at: 21 cm Tube secured with: Tape Dental Injury: Teeth and Oropharynx as per pre-operative assessment

## 2022-11-23 NOTE — Interval H&P Note (Signed)
History and Physical Interval Note:  11/23/2022 7:08 AM  Olivia Carpenter  has presented today for surgery, with the diagnosis of Symptomatic cholelithiasis.  The various methods of treatment have been discussed with the patient and family. After consideration of risks, benefits and other options for treatment, the patient has consented to  Procedure(s): XI ROBOTIC ASSISTED LAPAROSCOPIC CHOLECYSTECTOMY (N/A) INDOCYANINE GREEN FLUORESCENCE IMAGING (ICG) (N/A) as a surgical intervention.  The patient's history has been reviewed, patient examined, no change in status, stable for surgery.  I have reviewed the patient's chart and labs.  Questions were answered to the patient's satisfaction.     Hawa Henly

## 2022-11-23 NOTE — Transfer of Care (Signed)
Immediate Anesthesia Transfer of Care Note  Patient: Olivia Carpenter  Procedure(s) Performed: XI ROBOTIC ASSISTED LAPAROSCOPIC CHOLECYSTECTOMY (Abdomen) INDOCYANINE GREEN FLUORESCENCE IMAGING (ICG) (Abdomen)  Patient Location: PACU  Anesthesia Type:General  Level of Consciousness: awake  Airway & Oxygen Therapy: Patient Spontanous Breathing  Post-op Assessment: Report given to RN and Post -op Vital signs reviewed and stable  Post vital signs: Reviewed and stable  Last Vitals:  Vitals Value Taken Time  BP 101/86 11/23/22 0924  Temp 36.2 C 11/23/22 0924  Pulse 76 11/23/22 0926  Resp 14 11/23/22 0926  SpO2 98 % 11/23/22 0926  Vitals shown include unfiled device data.  Last Pain:  Vitals:   11/23/22 0647  TempSrc: Tympanic  PainSc: 0-No pain         Complications: No notable events documented.

## 2022-12-07 ENCOUNTER — Encounter: Payer: Self-pay | Admitting: Physician Assistant

## 2022-12-07 ENCOUNTER — Ambulatory Visit (INDEPENDENT_AMBULATORY_CARE_PROVIDER_SITE_OTHER): Payer: PRIVATE HEALTH INSURANCE | Admitting: Physician Assistant

## 2022-12-07 VITALS — BP 107/70 | HR 85 | Temp 98.1°F | Wt 175.4 lb

## 2022-12-07 DIAGNOSIS — K8020 Calculus of gallbladder without cholecystitis without obstruction: Secondary | ICD-10-CM

## 2022-12-07 DIAGNOSIS — Z09 Encounter for follow-up examination after completed treatment for conditions other than malignant neoplasm: Secondary | ICD-10-CM

## 2022-12-07 NOTE — Patient Instructions (Signed)

## 2022-12-07 NOTE — Progress Notes (Signed)
[facility] POST-OP OFFICE VISIT  12/07/2022  HPI: Olivia Carpenter is a 62 y.o. female 14 days s/p robotic assisted laparoscopic cholecystectomy for symptomatic cholelithiasis with Dr Aleen Campi  She is doing well No issues; very happy with how surgery went She has pain for about 3 days and then has been pain free No fever, chills, nausea, emesis She is tolerating PO; no diarrhea Incisions are well healed No other complains   Vital signs: LMP 05/13/2011    Physical Exam: Constitutional: Well appearing female, NAD Abdomen: Soft, non-tender, non-distended, no rebound/guarding Skin: Laparoscopic incisions are healing well, no erythema or drainage   Assessment/Plan: This is a 62 y.o. female 14 days s/p robotic assisted laparoscopic cholecystectomy for symptomatic cholelithiasis with Dr Aleen Campi   - Pain control prn  - Reviewed wound care recommendation  - Reviewed lifting restrictions; 4 weeks total  - Reviewed surgical pathology; CCC  - She can follow up on as needed basis; She understands to call with questions/concerns  -- Lynden Oxford, PA-C [facility] 12/07/2022, 1:59 PM M-F: 7am - 4pm

## 2023-04-20 ENCOUNTER — Encounter: Payer: Self-pay | Admitting: Dermatology

## 2023-04-20 ENCOUNTER — Ambulatory Visit: Payer: PRIVATE HEALTH INSURANCE | Admitting: Dermatology

## 2023-04-20 DIAGNOSIS — Z1283 Encounter for screening for malignant neoplasm of skin: Secondary | ICD-10-CM | POA: Diagnosis not present

## 2023-04-20 DIAGNOSIS — D225 Melanocytic nevi of trunk: Secondary | ICD-10-CM

## 2023-04-20 DIAGNOSIS — Z7189 Other specified counseling: Secondary | ICD-10-CM

## 2023-04-20 DIAGNOSIS — L719 Rosacea, unspecified: Secondary | ICD-10-CM

## 2023-04-20 DIAGNOSIS — W908XXA Exposure to other nonionizing radiation, initial encounter: Secondary | ICD-10-CM

## 2023-04-20 DIAGNOSIS — L84 Corns and callosities: Secondary | ICD-10-CM

## 2023-04-20 DIAGNOSIS — L578 Other skin changes due to chronic exposure to nonionizing radiation: Secondary | ICD-10-CM

## 2023-04-20 DIAGNOSIS — L820 Inflamed seborrheic keratosis: Secondary | ICD-10-CM

## 2023-04-20 DIAGNOSIS — L814 Other melanin hyperpigmentation: Secondary | ICD-10-CM

## 2023-04-20 DIAGNOSIS — Z86018 Personal history of other benign neoplasm: Secondary | ICD-10-CM

## 2023-04-20 DIAGNOSIS — L821 Other seborrheic keratosis: Secondary | ICD-10-CM

## 2023-04-20 DIAGNOSIS — D229 Melanocytic nevi, unspecified: Secondary | ICD-10-CM

## 2023-04-20 DIAGNOSIS — Z85820 Personal history of malignant melanoma of skin: Secondary | ICD-10-CM

## 2023-04-20 NOTE — Progress Notes (Signed)
Follow-Up Visit   Subjective  Olivia Carpenter is a 62 y.o. female who presents for the following: Skin Cancer Screening and Full Body Skin Exam, check spot R cheek, pt thinks ISK txted a few yrs ago and not sure if it is back, check spot R plantar foot, 64m, painful, pt has been sanding it down, check spot L breast, pt has had but looks dark  The patient presents for Total-Body Skin Exam (TBSE) for skin cancer screening and mole check. The patient has spots, moles and lesions to be evaluated, some may be new or changing and the patient may have concern these could be cancer.    The following portions of the chart were reviewed this encounter and updated as appropriate: medications, allergies, medical history  Review of Systems:  No other skin or systemic complaints except as noted in HPI or Assessment and Plan.  Objective  Well appearing patient in no apparent distress; mood and affect are within normal limits.  A full examination was performed including scalp, head, eyes, ears, nose, lips, neck, chest, axillae, abdomen, back, buttocks, bilateral upper extremities, bilateral lower extremities, hands, feet, fingers, toes, fingernails, and toenails. All findings within normal limits unless otherwise noted below.   Relevant physical exam findings are noted in the Assessment and Plan.  R cheek zygoma x 1 Stuck on waxy paps with erythema   L superior lateral breast   Assessment & Plan   SKIN CANCER SCREENING PERFORMED TODAY.  ACTINIC DAMAGE - Chronic condition, secondary to cumulative UV/sun exposure - diffuse scaly erythematous macules with underlying dyspigmentation - Recommend daily broad spectrum sunscreen SPF 30+ to sun-exposed areas, reapply every 2 hours as needed.  - Staying in the shade or wearing long sleeves, sun glasses (UVA+UVB protection) and wide brim hats (4-inch brim around the entire circumference of the hat) are also recommended for sun protection.  - Call for new  or changing lesions.  LENTIGINES, SEBORRHEIC KERATOSES, HEMANGIOMAS - Benign normal skin lesions - Benign-appearing - Call for any changes  MELANOCYTIC NEVI - Tan-brown and/or pink-flesh-colored symmetric macules and papules - Benign appearing on exam today - Observation - Call clinic for new or changing moles - Recommend daily use of broad spectrum spf 30+ sunscreen to sun-exposed areas.  - L superior lat breast0.4 x 0.3cm brown macule  HISTORY OF MELANOMA - No evidence of recurrence today - No lymphadenopathy - Recommend regular full body skin exams - Recommend daily broad spectrum sunscreen SPF 30+ to sun-exposed areas, reapply every 2 hours as needed.  - Call if any new or changing lesions are noted between office visits  - L forehead, R calf 2007  HISTORY OF DYSPLASTIC NEVUS No evidence of recurrence today Recommend regular full body skin exams Recommend daily broad spectrum sunscreen SPF 30+ to sun-exposed areas, reapply every 2 hours as needed.  Call if any new or changing lesions are noted between office visits  - R super pubic, R ant deltoid  ROSACEA face Exam Mid face erythema with telangiectasias   Chronic and persistent condition with duration or expected duration over one year. Condition is symptomatic / bothersome to patient. Not to goal.   Rosacea is a chronic progressive skin condition usually affecting the face of adults, causing redness and/or acne bumps. It is treatable but not curable. It sometimes affects the eyes (ocular rosacea) as well. It may respond to topical and/or systemic medication and can flare with stress, sun exposure, alcohol, exercise, topical steroids (including hydrocortisone/cortisone 10)  and some foods.  Daily application of broad spectrum spf 30+ sunscreen to face is recommended to reduce flares.  Patient denies grittiness of the eyes  Treatment Plan Counseling for BBL / IPL / Laser and Coordination of Care Discussed the treatment  option of Broad Band Light (BBL) /Intense Pulsed Light (IPL)/ Laser for skin discoloration, including brown spots and redness.  Typically we recommend at least 1-3 treatment sessions about 5-8 weeks apart for best results.  Cannot have tanned skin when BBL performed, and regular use of sunscreen/photoprotection is advised after the procedure to help maintain results. The patient's condition may also require "maintenance treatments" in the future.  The fee for BBL / laser treatments is $350 per treatment session for the whole face.  A fee can be quoted for other parts of the body.  Insurance typically does not pay for BBL/laser treatments and therefore the fee is an out-of-pocket cost. Recommend prophylactic valtrex treatment. Once scheduled for procedure, will send Rx in prior to patient's appointment.    CALLUS R plantar foot Exam: callus R plantar foot  Treatment Plan: Recommend otc Salicylic Acid qd  INFLAMED SEBORRHEIC KERATOSIS R cheek zygoma x 1 Symptomatic, irritating, patient would like treated. Destruction of lesion - R cheek zygoma x 1 Complexity: simple   Destruction method: cryotherapy   Informed consent: discussed and consent obtained   Timeout:  patient name, date of birth, surgical site, and procedure verified Lesion destroyed using liquid nitrogen: Yes   Region frozen until ice ball extended beyond lesion: Yes   Outcome: patient tolerated procedure well with no complications   Post-procedure details: wound care instructions given   Return in about 1 year (around 04/19/2024) for TBSE, Hx of Melanoma, Hx of Dysplastic nevi.  I, Ardis Rowan, RMA, am acting as scribe for Armida Sans, MD .   Documentation: I have reviewed the above documentation for accuracy and completeness, and I agree with the above.  Armida Sans, MD

## 2023-04-20 NOTE — Patient Instructions (Addendum)
 Cryotherapy Aftercare  Wash gently with soap and water everyday.   Apply Vaseline and Band-Aid daily until healed.   Counseling for BBL / IPL / Laser and Coordination of Care Discussed the treatment option of Broad Band Light (BBL) /Intense Pulsed Light (IPL)/ Laser for skin discoloration, including brown spots and redness.  Typically we recommend at least 1-3 treatment sessions about 5-8 weeks apart for best results.  Cannot have tanned skin when BBL performed, and regular use of sunscreen/photoprotection is advised after the procedure to help maintain results. The patient's condition may also require "maintenance treatments" in the future.  The fee for BBL / laser treatments is $350 per treatment session for the whole face.  A fee can be quoted for other parts of the body.  Insurance typically does not pay for BBL/laser treatments and therefore the fee is an out-of-pocket cost. Recommend prophylactic valtrex treatment. Once scheduled for procedure, will send Rx in prior to patient's appointment.     Due to recent changes in healthcare laws, you may see results of your pathology and/or laboratory studies on MyChart before the doctors have had a chance to review them. We understand that in some cases there may be results that are confusing or concerning to you. Please understand that not all results are received at the same time and often the doctors may need to interpret multiple results in order to provide you with the best plan of care or course of treatment. Therefore, we ask that you please give [place] 2 business days to thoroughly review all your results before contacting the office for clarification. Should we see a critical lab result, you will be contacted sooner.   If You Need Anything After Your Visit  If you have any questions or concerns for your doctor, please call our main line at [phone] and press option 4 to reach your doctor's medical assistant. If no one answers, please leave a  voicemail as directed and we will return your call as soon as possible. Messages left after 4 pm will be answered the following business day.   You may also send [place] a message via MyChart. We typically respond to MyChart messages within 1-2 business days.  For prescription refills, please ask your pharmacy to contact our office. Our fax number is [phone].  If you have an urgent issue when the clinic is closed that cannot wait until the next business day, you can page your doctor at the number below.    Please note that while we do our best to be available for urgent issues outside of office hours, we are not available 24/7.   If you have an urgent issue and are unable to reach [place], you may choose to seek medical care at your doctor's office, retail clinic, urgent care center, or emergency room.  If you have a medical emergency, please immediately call 911 or go to the emergency department.  Pager Numbers  - Dr. Gwen Pounds: [phone]  - Dr. Roseanne Reno: [phone]  - Dr. Katrinka Blazing: [phone]   In the event of inclement weather, please call our main line at [phone] for an update on the status of any delays or closures.  Dermatology Medication Tips: Please keep the boxes that topical medications come in in order to help keep track of the instructions about where and how to use these. Pharmacies typically print the medication instructions only on the boxes and not directly on the medication tubes.   If your medication is too expensive, please contact  our office at [phone] option 4 or send [place] a message through MyChart.   We are unable to tell what your co-pay for medications will be in advance as this is different depending on your insurance coverage. However, we may be able to find a substitute medication at lower cost or fill out paperwork to get insurance to cover a needed medication.   If a prior authorization is required to get your medication covered by your insurance  company, please allow [place] 1-2 business days to complete this process.  Drug prices often vary depending on where the prescription is filled and some pharmacies may offer cheaper prices.  The website www.goodrx.com contains coupons for medications through different pharmacies. The prices here do not account for what the cost may be with help from insurance (it may be cheaper with your insurance), but the website can give you the price if you did not use any insurance.  - You can print the associated coupon and take it with your prescription to the pharmacy.  - You may also stop by our office during regular business hours and pick up a GoodRx coupon card.  - If you need your prescription sent electronically to a different pharmacy, notify our office through [facility] or by phone at [phone] option 4.     Si Usted Necesita Algo Despus de Su Visita  Tambin puede enviarnos un mensaje a travs de Clinical cytogeneticist. Por lo general respondemos a los mensajes de MyChart en el transcurso de 1 a 2 das hbiles.  Para renovar recetas, por favor pida a su farmacia que se ponga en contacto con nuestra oficina. Annie Sable de fax es [place] [phone].  Si tiene un asunto urgente cuando la clnica est cerrada y que no puede esperar hasta el siguiente da hbil, puede llamar/localizar a su doctor(a) al nmero que aparece a continuacin.   Por favor, tenga en cuenta que aunque hacemos todo lo posible para estar disponibles para asuntos urgentes fuera del horario de [place], no estamos disponibles las 24 horas del da, los 7 [address]  [address] de la [place].   Si tiene un problema urgente y no puede comunicarse con nosotros, puede optar por buscar atencin mdica  en el consultorio de su doctor(a), en una clnica privada, en un centro de atencin urgente o en una sala de emergencias.  Si tiene Engineer, drilling, por favor llame inmediatamente al 911 o vaya a la sala de emergencias.  Nmeros de bper  - Dr.  Gwen Pounds: [phone]  - Dra. Roseanne Reno: [phone]  - Dr. Katrinka Blazing: [phone]   En caso de inclemencias del tiempo, por favor llame a Lacy Duverney principal al [phone] para una actualizacin sobre el [place] de cualquier retraso o cierre.  Consejos para la medicacin en dermatologa: Por favor, guarde las cajas en las que vienen los medicamentos de uso tpico para ayudarle a seguir las instrucciones sobre dnde y cmo usarlos. Las farmacias generalmente imprimen las instrucciones del medicamento slo en las cajas y no directamente en los tubos del [place].   Si su medicamento es muy caro, por favor, pngase en contacto con Rolm Gala llamando al [phone] y presione la opcin 4 o envenos un mensaje a travs de Clinical cytogeneticist.   No podemos decirle cul ser su copago por los medicamentos por adelantado ya que esto es diferente dependiendo de la cobertura de su seguro. Sin embargo, es posible que podamos encontrar un medicamento sustituto a Audiological scientist un formulario para que el seguro [place]  medicamento que se considera necesario.   Si se requiere una autorizacin previa para que su compaa de seguros [place] su medicamento, por favor permtanos de 1 a 2 das hbiles para completar [address]  Los precios de los medicamentos varan con frecuencia dependiendo del Environmental consultant de dnde se surte la receta y alguna farmacias pueden ofrecer precios ms baratos.  El sitio web www.goodrx.com tiene cupones para medicamentos de Health and safety inspector. Los precios aqu no tienen en cuenta lo que podra costar con la ayuda del seguro (puede ser ms barato con su seguro), pero el sitio web puede darle el precio si no utiliz Tourist information centre manager.  - Puede imprimir el cupn correspondiente y llevarlo con su receta a la farmacia.  - Tambin puede pasar por nuestra oficina durante el horario de atencin regular y Education officer, museum una tarjeta de cupones de GoodRx.  - Si necesita que su receta se enve  electrnicamente a una farmacia diferente, informe a nuestra oficina a travs de MyChart de [place] o por telfono llamando al [phone] y presione la opcin 4.

## 2023-05-01 ENCOUNTER — Encounter: Payer: Self-pay | Admitting: Dermatology

## 2024-02-08 ENCOUNTER — Ambulatory Visit: Payer: PRIVATE HEALTH INSURANCE | Admitting: Dermatology

## 2024-04-17 ENCOUNTER — Ambulatory Visit: Payer: PRIVATE HEALTH INSURANCE | Admitting: Dermatology

## 2024-04-17 DIAGNOSIS — L820 Inflamed seborrheic keratosis: Secondary | ICD-10-CM

## 2024-04-17 DIAGNOSIS — B353 Tinea pedis: Secondary | ICD-10-CM

## 2024-04-17 MED ORDER — KETOCONAZOLE 2 % EX CREA: TOPICAL_CREAM | CUTANEOUS | 6 refills | Status: AC

## 2024-04-17 NOTE — Progress Notes (Unsigned)
 Follow-Up Visit   Subjective  Olivia Carpenter is a 63 y.o. female who presents for the following: Skin Cancer Screening and Full Body Skin Exam hx of Melanoma, Dysplastic nevi  The patient presents for Total-Body Skin Exam (TBSE) for skin cancer screening and mole check. The patient has spots, moles and lesions to be evaluated, some may be new or changing and the patient may have concern these could be cancer.  The following portions of the chart were reviewed this encounter and updated as appropriate: medications, allergies, medical history  Review of Systems:  No other skin or systemic complaints except as noted in HPI or Assessment and Plan.  Objective  Well appearing patient in no apparent distress; mood and affect are within normal limits.  A full examination was performed including scalp, head, eyes, ears, nose, lips, neck, chest, axillae, abdomen, back, buttocks, bilateral upper extremities, bilateral lower extremities, hands, feet, fingers, toes, fingernails, and toenails. All findings within normal limits unless otherwise noted below.   Relevant physical exam findings are noted in the Assessment and Plan.  right infraclavicular x 1 Erythematous stuck-on, waxy papule or plaque See photo  Left Axilla x 12, chest x 4, right side x 1 (17) Erythematous stuck-on, waxy papule or plaque   Assessment & Plan   SKIN CANCER SCREENING PERFORMED TODAY.  ACTINIC DAMAGE - Chronic condition, secondary to cumulative UV/sun exposure - diffuse scaly erythematous macules with underlying dyspigmentation - Recommend daily broad spectrum sunscreen SPF 30+ to sun-exposed areas, reapply every 2 hours as needed.  - Staying in the shade or wearing long sleeves, sun glasses (UVA+UVB protection) and wide brim hats (4-inch brim around the entire circumference of the hat) are also recommended for sun protection.  - Call for new or changing lesions.  LENTIGINES, SEBORRHEIC KERATOSES, HEMANGIOMAS -  Benign normal skin lesions - Benign-appearing - Call for any changes  MELANOCYTIC NEVI - Tan-brown and/or pink-flesh-colored symmetric macules and papules - Benign appearing on exam today - Observation - Call clinic for new or changing moles - Recommend daily use of broad spectrum spf 30+ sunscreen to sun-exposed areas.   MILIA Exam: tiny erythematous firm white papule Discussed this is a type of cyst. Benign-appearing. Sometimes these will clear with OTC adapalene/Differin 0.1% cream QHS or retinol.   Patient given samples of aklief apply pea sized amount topically to affected area nightly as tolerated.   Topical retinoid medications like tretinoin /Retin-A , adapalene/Differin, tazarotene/Fabior, and Epiduo/Epiduo Forte can cause dryness and irritation when first started. Only apply a pea-sized amount to the entire affected area. Avoid applying it around the eyes, edges of mouth and creases at the nose. If you experience irritation, use a good moisturizer first and/or apply the medicine less often. If you are doing well with the medicine, you can increase how often you use it until you are applying every night. Be careful with sun protection while using this medication as it can make you sensitive to the sun. This medicine should not be used by pregnant women.    TINEA PEDIS Exam: Scaling and maceration web spaces and over distal and lateral soles. Treatment Plan: Start ketoconazole  2 % cream apply topically to entire foot including in between toes and toes nightly of both feet For 2 months 60g 6 rfs rx sent to [facility]  Discussed could consider oral treatment in future if necessary   Rash Irritation vs pimple Pink spot Right chest See photo If persistent after 6 weeks, return to  clinic for re-evaluation.  HISTORY OF MELANOMA - No evidence of recurrence today - No lymphadenopathy - Recommend regular full body skin exams - Recommend daily broad spectrum sunscreen SPF 30+  to sun-exposed areas, reapply every 2 hours as needed.  - Call if any new or changing lesions are noted between office visits  - L forehead, and R calf, 2007  HISTORY OF DYSPLASTIC NEVUS No evidence of recurrence today Recommend regular full body skin exams Recommend daily broad spectrum sunscreen SPF 30+ to sun-exposed areas, reapply every 2 hours as needed.  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 Condition is symptomatic/ bothersome to patient. Not currently at goal. Rosacea is a chronic progressive skin condition usually affecting the face of adults, causing redness and/or acne bumps. It is treatable but not curable. It sometimes affects the eyes (ocular rosacea) as well. It may respond to topical and/or systemic medication and can flare with stress, sun exposure, alcohol, exercise, topical steroids (including hydrocortisone/cortisone 10) and some foods.  Daily application of broad spectrum spf 30+ sunscreen to face is recommended to reduce flares.  Patient denies grittiness of the eyes  Treatment Plan Counseling for BBL / IPL / Laser and Coordination of Care Discussed the treatment option of Broad Band Light (BBL) /Intense Pulsed Light (IPL)/ Laser for skin discoloration, including brown spots and redness.  Typically we recommend at least 1-3 treatment sessions about 5-8 weeks apart for best results.  Cannot have tanned skin when BBL performed, and regular use of sunscreen/photoprotection is advised after the procedure to help maintain results. The patient's condition may also require maintenance treatments in the future.  The fee for BBL / laser treatments is $350 per treatment session for the whole face.  A fee can be quoted for other  parts of the body.  Insurance typically does not pay for BBL/laser treatments and therefore the fee is an out-of-pocket cost. Recommend prophylactic valtrex treatment. Once scheduled for procedure, will send Rx in prior to patient's appointment.  TINEA PEDIS OF BOTH FEET   This Visit - ketoconazole  (NIZORAL ) 2 % cream - Apply topically to entire foot including toes (both feet) nightly for fungus INFLAMED SEBORRHEIC KERATOSIS (18) Left Axilla x 12, chest x 4, right side x 1 (17), right infraclavicular x 1 Symptomatic, irritating, patient would like treated.  Discussed photo of right infraclavicular today, will treat with cryotherapy and recheck at next followup, if unresolved will consider bx - Destruction of lesion - Left Axilla x 12, chest x 4, right side x 1 (17), right infraclavicular x 1 Complexity: simple   Destruction method: cryotherapy   Informed consent: discussed and consent obtained   Timeout:  patient name, date of birth, surgical site, and procedure verified Lesion destroyed using liquid nitrogen: Yes   Region frozen until ice ball extended beyond lesion: Yes   Outcome: patient tolerated procedure well with no complications   Post-procedure details: wound care instructions given    SKIN CANCER SCREENING   HISTORY OF MALIGNANT MELANOMA   HISTORY OF DYSPLASTIC NEVUS   ACTINIC SKIN DAMAGE   LENTIGO   MELANOCYTIC NEVUS, UNSPECIFIED LOCATION   ROSACEA   COUNSELING AND COORDINATION OF CARE   MEDICATION MANAGEMENT   Return for 6 month recheck isk at right infraclavicular  and tinea  pedis, 1 year tbse .  IEleanor Blush, CMA, am acting as scribe for Alm Rhyme, MD.   Documentation: I have reviewed the above documentation for accuracy and completeness, and I agree with the above.  Alm Rhyme, MD

## 2024-04-17 NOTE — Patient Instructions (Addendum)
 For Fungus at feet  Start ketoconazole  2 % cream apply topically nightly to both feet including toes inbetween toes, toenail for 2 months    For milia   Start aklief - apply pea sized amount to area nightly as tolerated  Topical retinoid medications like tretinoin /Retin-A , adapalene/Differin, tazarotene/Fabior, and Epiduo/Epiduo Forte can cause dryness and irritation when first started. Only apply a pea-sized amount to the entire affected area. Avoid applying it around the eyes, edges of mouth and creases at the nose. If you experience irritation, use a good moisturizer first and/or apply the medicine less often. If you are doing well with the medicine, you can increase how often you use it until you are applying every night. Be careful with sun protection while using this medication as it can make you sensitive to the sun. This medicine should not be used by pregnant women.   Seborrheic Keratosis  What causes seborrheic keratoses? Seborrheic keratoses are harmless, common skin growths that first appear during adult life.  As time goes by, more growths appear.  Some people may develop a large number of them.  Seborrheic keratoses appear on both covered and uncovered body parts.  They are not caused by sunlight.  The tendency to develop seborrheic keratoses can be inherited.  They vary in color from skin-colored to gray, brown, or even black.  They can be either smooth or have a rough, warty surface.   Seborrheic keratoses are superficial and look as if they were stuck on the skin.  Under the microscope this type of keratosis looks like layers upon layers of skin.  That is why at times the top layer may seem to fall off, but the rest of the growth remains and re-grows.    Treatment Seborrheic keratoses do not need to be treated, but can easily be removed in the office.  Seborrheic keratoses often cause symptoms when they rub on clothing or jewelry.  Lesions can be in the way of shaving.  If they  become inflamed, they can cause itching, soreness, or burning.  Removal of a seborrheic keratosis can be accomplished by freezing, burning, or surgery. If any spot bleeds, scabs, or grows rapidly, please return to have it checked, as these can be an indication of a skin cancer.   Cryotherapy Aftercare  Wash gently with soap and water everyday.   Apply Vaseline and Band-Aid daily until healed.     Melanoma ABCDEs  Melanoma is the most dangerous type of skin cancer, and is the leading cause of death from skin disease.  You are more likely to develop melanoma if you: Have light-colored skin, light-colored eyes, or red or blond hair Spend a lot of time in the sun Tan regularly, either outdoors or in a tanning bed Have had blistering sunburns, especially during childhood Have a close family member who has had a melanoma Have atypical moles or large birthmarks  Early detection of melanoma is key since treatment is typically straightforward and cure rates are extremely high if we catch it early.   The first sign of melanoma is often a change in a mole or a new dark spot.  The ABCDE system is a way of remembering the signs of melanoma.  A for asymmetry:  The two halves do not match. B for border:  The edges of the growth are irregular. C for color:  A mixture of colors are present instead of an even brown color. D for diameter:  Melanomas are usually (but not always)  greater than 6mm - the size of a pencil eraser. E for evolution:  The spot keeps changing in size, shape, and color.  Please check your skin once per month between visits. You can use a small mirror in front and a large mirror behind you to keep an eye on the back side or your body.   If you see any new or changing lesions before your next follow-up, please call to schedule a visit.  Please continue daily skin protection including broad spectrum sunscreen SPF 30+ to sun-exposed areas, reapplying every 2 hours as needed when  you're outdoors.   Staying in the shade or wearing long sleeves, sun glasses (UVA+UVB protection) and wide brim hats (4-inch brim around the entire circumference of the hat) are also recommended for sun protection.      Due to recent changes in healthcare laws, you may see results of your pathology and/or laboratory studies on MyChart before the doctors have had a chance to review them. We understand that in some cases there may be results that are confusing or concerning to you. Please understand that not all results are received at the same time and often the doctors may need to interpret multiple results in order to provide you with the best plan of care or course of treatment. Therefore, we ask that you please give us  2 business days to thoroughly review all your results before contacting the office for clarification. Should we see a critical lab result, you will be contacted sooner.   If You Need Anything After Your Visit  If you have any questions or concerns for your doctor, please call our main line at [phone] and press option 4 to reach your doctor's medical assistant. If no one answers, please leave a voicemail as directed and we will return your call as soon as possible. Messages left after 4 pm will be answered the following business day.   You may also send us  a message via MyChart. We typically respond to MyChart messages within 1-2 business days.  For prescription refills, please ask your pharmacy to contact our office. Our fax number is [phone].  If you have an urgent issue when the clinic is closed that cannot wait until the next business day, you can page your doctor at the number below.    Please note that while we do our best to be available for urgent issues outside of office hours, we are not available 24/7.   If you have an urgent issue and are unable to reach us , you may choose to seek medical care at your doctor's office, retail clinic, urgent care center, or  emergency room.  If you have a medical emergency, please immediately call 911 or go to the emergency department.  Pager Numbers  - Dr. Hester: [phone]  - Dr. Jackquline: [phone]  - Dr. Claudene: [phone]   - Dr. Raymund: [phone]  In the event of inclement weather, please call our main line at [phone] for an update on the status of any delays or closures.  Dermatology Medication Tips: Please keep the boxes that topical medications come in in order to help keep track of the instructions about where and how to use these. Pharmacies typically print the medication instructions only on the boxes and not directly on the medication tubes.   If your medication is too expensive, please contact our office at [phone] option 4 or send us  a message through MyChart.   We are unable to tell what your co-pay  for medications will be in advance as this is different depending on your insurance coverage. However, we may be able to find a substitute medication at lower cost or fill out paperwork to get insurance to cover a needed medication.   If a prior authorization is required to get your medication covered by your insurance company, please allow us  1-2 business days to complete this process.  Drug prices often vary depending on where the prescription is filled and some pharmacies may offer cheaper prices.  The website www.goodrx.com contains coupons for medications through different pharmacies. The prices here do not account for what the cost may be with help from insurance (it may be cheaper with your insurance), but the website can give you the price if you did not use any insurance.  - You can print the associated coupon and take it with your prescription to the pharmacy.  - You may also stop by our office during regular business hours and pick up a GoodRx coupon card.  - If you need your prescription sent electronically to a different pharmacy, notify our office through [facility] or by phone at [phone] option 4.     Si Usted Necesita Algo Despus de Su Visita  Tambin puede enviarnos un mensaje a travs de Clinical Cytogeneticist. Por lo general respondemos a los mensajes de MyChart en el transcurso de 1 a 2 das hbiles.  Para renovar recetas, por favor pida a su farmacia que se ponga en contacto con nuestra oficina. Randi lakes de fax es [place] [phone].  Si tiene un asunto urgente cuando la clnica est cerrada y que no puede esperar hasta el siguiente da hbil, puede llamar/localizar a su doctor(a) al nmero que aparece a continuacin.   Por favor, tenga en cuenta que aunque hacemos todo lo posible para estar disponibles para asuntos urgentes fuera del horario de [place], no estamos disponibles las 24 horas del da, los 7 [address]  [address] de la [place].   Si tiene un problema urgente y no puede comunicarse con nosotros, puede optar por buscar atencin mdica  en el consultorio de su doctor(a), en una clnica privada, en un centro de atencin urgente o en una sala de emergencias.  Si tiene engineer, drilling, por favor llame inmediatamente al 911 o vaya a la sala de emergencias.  Nmeros de bper  - Dr. Hester: [phone]  - Dra. Jackquline: [phone]  - Dr. Claudene: [phone]  - Dra. Kitts: [phone]  En caso de inclemencias del [place], por favor llame a nuestra lnea principal al [phone] para una actualizacin sobre el estado de cualquier retraso o cierre.  Consejos para la medicacin en dermatologa: Por favor, guarde las cajas en las que vienen los medicamentos de uso tpico para ayudarle a seguir las instrucciones sobre dnde y cmo usarlos. Las farmacias generalmente imprimen las instrucciones del medicamento slo en las cajas y no directamente en los tubos del [place].   Si su medicamento es muy caro, por favor, pngase en contacto con landry rieger llamando al [phone] y presione la opcin 4 o envenos un mensaje a travs de  Clinical Cytogeneticist.   No podemos decirle cul ser su copago por los medicamentos por adelantado ya que esto es diferente dependiendo de la cobertura de su seguro. Sin embargo, es posible que podamos encontrar un medicamento sustituto a audiological scientist un formulario para que el seguro cubra el medicamento que se considera necesario.   Si se requiere una autorizacin previa para que su compaa de seguros  cubra su medicamento, por favor permtanos de 1 a 2 das hbiles para completar [address]  Los precios de los medicamentos varan con frecuencia dependiendo del environmental consultant de dnde se surte la receta y alguna farmacias pueden ofrecer precios ms baratos.  El sitio web www.goodrx.com tiene cupones para medicamentos de health and safety inspector. Los precios aqu no tienen en cuenta lo que podra costar con la ayuda del seguro (puede ser ms barato con su seguro), pero el sitio web puede darle el precio si no utiliz tourist information centre manager.  - Puede imprimir el cupn correspondiente y llevarlo con su receta a la farmacia.  - Tambin puede pasar por nuestra oficina durante el horario de atencin regular y education officer, museum una tarjeta de cupones de GoodRx.  - Si necesita que su receta se enve electrnicamente a una farmacia diferente, informe a nuestra oficina a travs de MyChart de [place] o por telfono llamando al [phone] y presione la opcin 4.

## 2024-04-18 ENCOUNTER — Encounter: Payer: Self-pay | Admitting: Dermatology

## 2024-04-19 ENCOUNTER — Ambulatory Visit: Payer: PRIVATE HEALTH INSURANCE | Admitting: Dermatology

## 2024-05-23 ENCOUNTER — Ambulatory Visit (INDEPENDENT_AMBULATORY_CARE_PROVIDER_SITE_OTHER): Payer: PRIVATE HEALTH INSURANCE | Admitting: Dermatology

## 2024-05-23 ENCOUNTER — Encounter: Payer: Self-pay | Admitting: Dermatology

## 2024-05-23 DIAGNOSIS — L814 Other melanin hyperpigmentation: Secondary | ICD-10-CM

## 2024-05-23 DIAGNOSIS — L719 Rosacea, unspecified: Secondary | ICD-10-CM

## 2024-05-23 DIAGNOSIS — L578 Other skin changes due to chronic exposure to nonionizing radiation: Secondary | ICD-10-CM

## 2024-05-23 DIAGNOSIS — I781 Nevus, non-neoplastic: Secondary | ICD-10-CM

## 2024-05-23 NOTE — Progress Notes (Signed)
 "  Follow-Up Visit   Subjective  Olivia Carpenter is a 64 y.o. female who presents for the following: 1st bbl treatment for reds at face and browns at face   The following portions of the chart were reviewed this encounter and updated as appropriate: medications, allergies, medical history  Review of Systems:  No other skin or systemic complaints except as noted in HPI or Assessment and Plan.  Objective  Well appearing patient in no apparent distress; mood and affect are within normal limits.  A focused examination was performed of the following areas: face  Relevant exam findings are noted in the Assessment and Plan.  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  Patient was fitted with laser safety goggles and advised to keep eyes closed during procedure with goggles on. Staff and provider ensured that patient and their own safety goggles were also on and eyes protected during procedure. Laser room door was secured and locked from the inside. Laser room door has laser safety sign affixed to the outside of the door.    Used 1 cm circular crystal on cheeks and chin today  and 1/2 cm circular crystal for nose    [place] BBL - 05/23/24 1500      Patient Details   Skin Type: I    Anesthestic Cream Applied: No    Photo Takes: Yes    Consent Signed: Yes      Treatment Details   Date: 05/23/24    Treatment #: 1    Area: f    Filter: 1st Pass;2nd Pass      1st Pass   Location: F    Device: 560   vascular (reds at cheeks, chin, nose)   BBL j/cm2: 25    PW Msec Sec: 27    Target Temp: 20    Pulses: 49    11mm: this one      Patient given cooling gel mask and bbl starter kit   Patient tolerated the procedure well.   Austin avoidance was stressed. The patient will call with any problems, questions or concerns prior to their next appointment.  Benign appearing on exam Call for changes  LENTIGINES/ EARLY SKS/ ACTINIC DAMAGE  Exam: scattered tan macules at forehead, temple, cheeks, chin, nose, upper lip  Treatment Plan: BBL today  Laser safety: Patient was advised in laser safety.  Patient was fitted with laser safety goggles and advised to keep eyes closed during procedure with goggles on. Staff and provider ensured that patient and their own safety goggles were also on and eyes protected during procedure. Laser room door was secured and locked from the inside. Laser room door has laser safety sign affixed to the outside of the door.   [place] BBL - 05/23/24 1500      Patient Details   Skin Type: I    Anesthestic Cream Applied: No    Photo Takes: Yes    Consent Signed: Yes      Treatment Details   Date: 05/23/24    Treatment #: 1    Area: f  Filter: 1st Pass;2nd Pass       2nd Pass   Location: F    Device: 515   pigmented lesions (browns at forehead, temples, cheeks,  chin, nose and upper lip)   BBL j/cm2: 12    PW Msec Sec: 10    Target Temp: 25    Pulses: 75    15x15: this one    Patient given cooling gel mask and bbl starter kit   Patient tolerated the procedure well.   Austin avoidance was stressed. The patient will call with any problems, questions or concerns prior to their next appointment.    ACTINIC DAMAGE - chronic, secondary to cumulative UV radiation exposure/sun exposure over time - diffuse scaly erythematous macules with underlying dyspigmentation - Recommend daily broad spectrum sunscreen SPF 30+ to sun-exposed areas, reapply every 2 hours as needed.  - Recommend staying in the shade or wearing long sleeves, sun glasses (UVA+UVB protection) and wide brim hats  (4-inch brim around the entire circumference of the hat). - Call for new or changing lesions.    Return if symptoms worsen or fail to improve.  IEleanor Blush, CMA, am acting as scribe for Alm Rhyme, MD.   Documentation: I have reviewed the above documentation for accuracy and completeness, and I agree with the above.  Alm Rhyme, MD    "

## 2024-05-23 NOTE — Patient Instructions (Signed)

## 2024-06-05 ENCOUNTER — Ambulatory Visit: Admitting: Dermatology

## 2024-06-05 ENCOUNTER — Encounter: Payer: Self-pay | Admitting: Dermatology

## 2024-06-05 DIAGNOSIS — Z79899 Other long term (current) drug therapy: Secondary | ICD-10-CM

## 2024-06-05 DIAGNOSIS — A498 Other bacterial infections of unspecified site: Secondary | ICD-10-CM | POA: Diagnosis not present

## 2024-06-05 DIAGNOSIS — Z7189 Other specified counseling: Secondary | ICD-10-CM

## 2024-06-05 DIAGNOSIS — L603 Nail dystrophy: Secondary | ICD-10-CM | POA: Diagnosis not present

## 2024-06-05 MED ORDER — TOBRAMYCIN 0.3 % OP SOLN: OPHTHALMIC | 1 refills | Status: AC

## 2024-06-05 NOTE — Patient Instructions (Signed)

## 2024-07-24 ENCOUNTER — Ambulatory Visit: Admitting: Dermatology

## 2024-10-16 ENCOUNTER — Ambulatory Visit: Admitting: Dermatology

## 2025-04-17 ENCOUNTER — Ambulatory Visit: Admitting: Dermatology
